# Patient Record
Sex: Male | Born: 1950
Health system: Southern US, Community
[De-identification: ages and names within clinical notes are randomized; demographics above are authoritative.]

## PROBLEM LIST (undated history)

## (undated) DIAGNOSIS — F0390 Unspecified dementia without behavioral disturbance: Secondary | ICD-10-CM

## (undated) DIAGNOSIS — I10 Essential (primary) hypertension: Secondary | ICD-10-CM

## (undated) DIAGNOSIS — E119 Type 2 diabetes mellitus without complications: Secondary | ICD-10-CM

## (undated) DIAGNOSIS — F329 Major depressive disorder, single episode, unspecified: Secondary | ICD-10-CM

## (undated) DIAGNOSIS — E7800 Pure hypercholesterolemia, unspecified: Secondary | ICD-10-CM

## (undated) DIAGNOSIS — F32A Depression, unspecified: Secondary | ICD-10-CM

## (undated) DIAGNOSIS — I714 Abdominal aortic aneurysm, without rupture, unspecified: Secondary | ICD-10-CM

## (undated) HISTORY — DX: Type 2 diabetes mellitus without complications: E11.9

## (undated) HISTORY — DX: Abdominal aortic aneurysm, without rupture, unspecified: I71.40

---
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
## 2004-10-24 ENCOUNTER — Ambulatory Visit (HOSPITAL_COMMUNITY): Admission: RE | Admit: 2004-10-24 | Discharge: 2004-10-24 | Payer: Self-pay | Admitting: *Deleted

## 2011-07-24 ENCOUNTER — Other Ambulatory Visit: Payer: Self-pay | Admitting: Internal Medicine

## 2011-07-24 DIAGNOSIS — R519 Headache, unspecified: Secondary | ICD-10-CM

## 2011-07-25 ENCOUNTER — Ambulatory Visit
Admission: RE | Admit: 2011-07-25 | Discharge: 2011-07-25 | Disposition: A | Discharge status: Home / Self Care | Payer: BC Managed Care – PPO | Source: Ambulatory Visit | Attending: Internal Medicine | Admitting: Internal Medicine

## 2011-07-25 DIAGNOSIS — R519 Headache, unspecified: Secondary | ICD-10-CM

## 2011-09-05 ENCOUNTER — Other Ambulatory Visit: Payer: Self-pay | Admitting: Neurology

## 2011-09-05 DIAGNOSIS — R519 Headache, unspecified: Secondary | ICD-10-CM

## 2011-09-09 ENCOUNTER — Ambulatory Visit
Admission: RE | Admit: 2011-09-09 | Discharge: 2011-09-09 | Disposition: A | Discharge status: Home / Self Care | Payer: BC Managed Care – PPO | Source: Ambulatory Visit | Attending: Neurology | Admitting: Neurology

## 2011-09-09 DIAGNOSIS — R519 Headache, unspecified: Secondary | ICD-10-CM

## 2011-09-09 MED ORDER — GADOBENATE DIMEGLUMINE 529 MG/ML IV SOLN
20.0000 mL | Freq: Once | INTRAVENOUS | Status: AC | PRN
  Administered 2011-09-09: 20 mL via INTRAVENOUS

## 2011-11-19 HISTORY — PX: OTHER SURGICAL HISTORY: SHX169

## 2011-11-21 DIAGNOSIS — G500 Trigeminal neuralgia: Secondary | ICD-10-CM | POA: Insufficient documentation

## 2011-11-21 DIAGNOSIS — Q282 Arteriovenous malformation of cerebral vessels: Secondary | ICD-10-CM | POA: Insufficient documentation

## 2011-12-17 DIAGNOSIS — I10 Essential (primary) hypertension: Secondary | ICD-10-CM | POA: Insufficient documentation

## 2011-12-17 DIAGNOSIS — E785 Hyperlipidemia, unspecified: Secondary | ICD-10-CM | POA: Insufficient documentation

## 2011-12-28 DIAGNOSIS — Z9889 Other specified postprocedural states: Secondary | ICD-10-CM | POA: Insufficient documentation

## 2012-02-13 DIAGNOSIS — H53129 Transient visual loss, unspecified eye: Secondary | ICD-10-CM | POA: Insufficient documentation

## 2012-03-26 DIAGNOSIS — Z8774 Personal history of (corrected) congenital malformations of heart and circulatory system: Secondary | ICD-10-CM | POA: Insufficient documentation

## 2012-04-06 ENCOUNTER — Other Ambulatory Visit: Payer: Self-pay | Admitting: Internal Medicine

## 2012-04-06 ENCOUNTER — Other Ambulatory Visit (HOSPITAL_COMMUNITY): Payer: Self-pay | Admitting: Internal Medicine

## 2012-04-06 DIAGNOSIS — R1011 Right upper quadrant pain: Secondary | ICD-10-CM

## 2012-04-06 DIAGNOSIS — R1110 Vomiting, unspecified: Secondary | ICD-10-CM

## 2012-04-07 ENCOUNTER — Ambulatory Visit (HOSPITAL_COMMUNITY)
Admission: RE | Admit: 2012-04-07 | Discharge: 2012-04-07 | Disposition: A | Discharge status: Home / Self Care | Payer: BC Managed Care – PPO | Source: Ambulatory Visit | Attending: Internal Medicine | Admitting: Internal Medicine

## 2012-04-07 DIAGNOSIS — Q619 Cystic kidney disease, unspecified: Secondary | ICD-10-CM | POA: Insufficient documentation

## 2012-04-07 DIAGNOSIS — R109 Unspecified abdominal pain: Secondary | ICD-10-CM | POA: Insufficient documentation

## 2012-04-07 DIAGNOSIS — R1011 Right upper quadrant pain: Secondary | ICD-10-CM

## 2012-04-07 DIAGNOSIS — R1110 Vomiting, unspecified: Secondary | ICD-10-CM | POA: Insufficient documentation

## 2013-12-28 ENCOUNTER — Other Ambulatory Visit: Payer: Self-pay | Admitting: Internal Medicine

## 2013-12-28 DIAGNOSIS — N451 Epididymitis: Secondary | ICD-10-CM

## 2013-12-29 ENCOUNTER — Ambulatory Visit
Admission: RE | Admit: 2013-12-29 | Discharge: 2013-12-29 | Disposition: A | Discharge status: Home / Self Care | Payer: No Typology Code available for payment source | Source: Ambulatory Visit | Attending: Internal Medicine | Admitting: Internal Medicine

## 2013-12-29 DIAGNOSIS — N451 Epididymitis: Secondary | ICD-10-CM

## 2014-06-29 DIAGNOSIS — N453 Epididymo-orchitis: Secondary | ICD-10-CM | POA: Diagnosis not present

## 2014-07-13 DIAGNOSIS — I10 Essential (primary) hypertension: Secondary | ICD-10-CM | POA: Diagnosis not present

## 2014-07-13 DIAGNOSIS — N453 Epididymo-orchitis: Secondary | ICD-10-CM | POA: Diagnosis not present

## 2014-07-13 DIAGNOSIS — E782 Mixed hyperlipidemia: Secondary | ICD-10-CM | POA: Diagnosis not present

## 2014-07-13 DIAGNOSIS — IMO0001 Reserved for inherently not codable concepts without codable children: Secondary | ICD-10-CM | POA: Diagnosis not present

## 2014-08-05 DIAGNOSIS — N509 Disorder of male genital organs, unspecified: Secondary | ICD-10-CM | POA: Diagnosis not present

## 2014-08-05 DIAGNOSIS — N453 Epididymo-orchitis: Secondary | ICD-10-CM | POA: Diagnosis not present

## 2015-10-19 DIAGNOSIS — E782 Mixed hyperlipidemia: Secondary | ICD-10-CM | POA: Diagnosis not present

## 2015-10-19 DIAGNOSIS — I10 Essential (primary) hypertension: Secondary | ICD-10-CM | POA: Diagnosis not present

## 2015-10-19 DIAGNOSIS — E1165 Type 2 diabetes mellitus with hyperglycemia: Secondary | ICD-10-CM | POA: Diagnosis not present

## 2015-10-26 DIAGNOSIS — G500 Trigeminal neuralgia: Secondary | ICD-10-CM | POA: Diagnosis not present

## 2015-10-26 DIAGNOSIS — I10 Essential (primary) hypertension: Secondary | ICD-10-CM | POA: Diagnosis not present

## 2015-10-26 DIAGNOSIS — E1165 Type 2 diabetes mellitus with hyperglycemia: Secondary | ICD-10-CM | POA: Diagnosis not present

## 2015-10-26 DIAGNOSIS — E782 Mixed hyperlipidemia: Secondary | ICD-10-CM | POA: Diagnosis not present

## 2016-01-24 DIAGNOSIS — E119 Type 2 diabetes mellitus without complications: Secondary | ICD-10-CM | POA: Diagnosis not present

## 2016-01-24 DIAGNOSIS — H52223 Regular astigmatism, bilateral: Secondary | ICD-10-CM | POA: Diagnosis not present

## 2016-01-24 DIAGNOSIS — H524 Presbyopia: Secondary | ICD-10-CM | POA: Diagnosis not present

## 2016-01-24 DIAGNOSIS — Z7984 Long term (current) use of oral hypoglycemic drugs: Secondary | ICD-10-CM | POA: Diagnosis not present

## 2016-01-24 DIAGNOSIS — H5202 Hypermetropia, left eye: Secondary | ICD-10-CM | POA: Diagnosis not present

## 2016-03-07 DIAGNOSIS — G500 Trigeminal neuralgia: Secondary | ICD-10-CM | POA: Diagnosis not present

## 2016-03-07 DIAGNOSIS — E782 Mixed hyperlipidemia: Secondary | ICD-10-CM | POA: Diagnosis not present

## 2016-03-07 DIAGNOSIS — I10 Essential (primary) hypertension: Secondary | ICD-10-CM | POA: Diagnosis not present

## 2016-03-07 DIAGNOSIS — E1165 Type 2 diabetes mellitus with hyperglycemia: Secondary | ICD-10-CM | POA: Diagnosis not present

## 2016-03-14 DIAGNOSIS — I671 Cerebral aneurysm, nonruptured: Secondary | ICD-10-CM | POA: Diagnosis not present

## 2016-03-14 DIAGNOSIS — E1165 Type 2 diabetes mellitus with hyperglycemia: Secondary | ICD-10-CM | POA: Diagnosis not present

## 2016-03-14 DIAGNOSIS — I69993 Ataxia following unspecified cerebrovascular disease: Secondary | ICD-10-CM | POA: Diagnosis not present

## 2016-03-14 DIAGNOSIS — E782 Mixed hyperlipidemia: Secondary | ICD-10-CM | POA: Diagnosis not present

## 2016-09-19 DIAGNOSIS — E782 Mixed hyperlipidemia: Secondary | ICD-10-CM | POA: Diagnosis not present

## 2016-09-19 DIAGNOSIS — E1165 Type 2 diabetes mellitus with hyperglycemia: Secondary | ICD-10-CM | POA: Diagnosis not present

## 2016-09-26 DIAGNOSIS — Z23 Encounter for immunization: Secondary | ICD-10-CM | POA: Diagnosis not present

## 2016-09-26 DIAGNOSIS — I10 Essential (primary) hypertension: Secondary | ICD-10-CM | POA: Diagnosis not present

## 2016-09-26 DIAGNOSIS — E782 Mixed hyperlipidemia: Secondary | ICD-10-CM | POA: Diagnosis not present

## 2016-09-26 DIAGNOSIS — E1165 Type 2 diabetes mellitus with hyperglycemia: Secondary | ICD-10-CM | POA: Diagnosis not present

## 2017-03-20 DIAGNOSIS — E782 Mixed hyperlipidemia: Secondary | ICD-10-CM | POA: Diagnosis not present

## 2017-03-20 DIAGNOSIS — Z0000 Encounter for general adult medical examination without abnormal findings: Secondary | ICD-10-CM | POA: Diagnosis not present

## 2017-03-20 DIAGNOSIS — Z23 Encounter for immunization: Secondary | ICD-10-CM | POA: Diagnosis not present

## 2017-03-20 DIAGNOSIS — Z125 Encounter for screening for malignant neoplasm of prostate: Secondary | ICD-10-CM | POA: Diagnosis not present

## 2017-03-20 DIAGNOSIS — E1165 Type 2 diabetes mellitus with hyperglycemia: Secondary | ICD-10-CM | POA: Diagnosis not present

## 2017-03-27 DIAGNOSIS — R269 Unspecified abnormalities of gait and mobility: Secondary | ICD-10-CM | POA: Diagnosis not present

## 2017-03-27 DIAGNOSIS — E782 Mixed hyperlipidemia: Secondary | ICD-10-CM | POA: Diagnosis not present

## 2017-03-27 DIAGNOSIS — E1165 Type 2 diabetes mellitus with hyperglycemia: Secondary | ICD-10-CM | POA: Diagnosis not present

## 2017-03-27 DIAGNOSIS — R1033 Periumbilical pain: Secondary | ICD-10-CM | POA: Diagnosis not present

## 2017-05-07 DIAGNOSIS — R319 Hematuria, unspecified: Secondary | ICD-10-CM | POA: Diagnosis not present

## 2017-08-29 DIAGNOSIS — Z23 Encounter for immunization: Secondary | ICD-10-CM | POA: Diagnosis not present

## 2017-10-16 DIAGNOSIS — E782 Mixed hyperlipidemia: Secondary | ICD-10-CM | POA: Diagnosis not present

## 2017-10-16 DIAGNOSIS — E1165 Type 2 diabetes mellitus with hyperglycemia: Secondary | ICD-10-CM | POA: Diagnosis not present

## 2017-10-23 DIAGNOSIS — E1165 Type 2 diabetes mellitus with hyperglycemia: Secondary | ICD-10-CM | POA: Diagnosis not present

## 2017-10-23 DIAGNOSIS — R269 Unspecified abnormalities of gait and mobility: Secondary | ICD-10-CM | POA: Diagnosis not present

## 2017-10-23 DIAGNOSIS — I671 Cerebral aneurysm, nonruptured: Secondary | ICD-10-CM | POA: Diagnosis not present

## 2017-10-23 DIAGNOSIS — E782 Mixed hyperlipidemia: Secondary | ICD-10-CM | POA: Diagnosis not present

## 2017-10-23 DIAGNOSIS — I10 Essential (primary) hypertension: Secondary | ICD-10-CM | POA: Diagnosis not present

## 2017-10-23 DIAGNOSIS — N50812 Left testicular pain: Secondary | ICD-10-CM | POA: Diagnosis not present

## 2017-10-30 DIAGNOSIS — N5082 Scrotal pain: Secondary | ICD-10-CM | POA: Diagnosis not present

## 2017-10-30 DIAGNOSIS — N50819 Testicular pain, unspecified: Secondary | ICD-10-CM | POA: Diagnosis not present

## 2017-11-06 DIAGNOSIS — N5082 Scrotal pain: Secondary | ICD-10-CM | POA: Diagnosis not present

## 2018-03-04 DIAGNOSIS — E119 Type 2 diabetes mellitus without complications: Secondary | ICD-10-CM | POA: Diagnosis not present

## 2018-04-09 DIAGNOSIS — E782 Mixed hyperlipidemia: Secondary | ICD-10-CM | POA: Diagnosis not present

## 2018-04-09 DIAGNOSIS — Z0000 Encounter for general adult medical examination without abnormal findings: Secondary | ICD-10-CM | POA: Diagnosis not present

## 2018-04-09 DIAGNOSIS — I10 Essential (primary) hypertension: Secondary | ICD-10-CM | POA: Diagnosis not present

## 2018-04-09 DIAGNOSIS — Z125 Encounter for screening for malignant neoplasm of prostate: Secondary | ICD-10-CM | POA: Diagnosis not present

## 2018-04-09 DIAGNOSIS — M549 Dorsalgia, unspecified: Secondary | ICD-10-CM | POA: Diagnosis not present

## 2018-04-09 DIAGNOSIS — E1165 Type 2 diabetes mellitus with hyperglycemia: Secondary | ICD-10-CM | POA: Diagnosis not present

## 2018-04-16 DIAGNOSIS — E6609 Other obesity due to excess calories: Secondary | ICD-10-CM | POA: Diagnosis not present

## 2018-04-16 DIAGNOSIS — R69 Illness, unspecified: Secondary | ICD-10-CM | POA: Diagnosis not present

## 2018-04-16 DIAGNOSIS — E782 Mixed hyperlipidemia: Secondary | ICD-10-CM | POA: Diagnosis not present

## 2018-04-16 DIAGNOSIS — I69993 Ataxia following unspecified cerebrovascular disease: Secondary | ICD-10-CM | POA: Diagnosis not present

## 2018-04-16 DIAGNOSIS — I10 Essential (primary) hypertension: Secondary | ICD-10-CM | POA: Diagnosis not present

## 2018-04-16 DIAGNOSIS — R269 Unspecified abnormalities of gait and mobility: Secondary | ICD-10-CM | POA: Diagnosis not present

## 2018-04-16 DIAGNOSIS — E1165 Type 2 diabetes mellitus with hyperglycemia: Secondary | ICD-10-CM | POA: Diagnosis not present

## 2018-04-16 DIAGNOSIS — G500 Trigeminal neuralgia: Secondary | ICD-10-CM | POA: Diagnosis not present

## 2018-07-06 DIAGNOSIS — H40053 Ocular hypertension, bilateral: Secondary | ICD-10-CM | POA: Diagnosis not present

## 2018-07-24 DIAGNOSIS — R69 Illness, unspecified: Secondary | ICD-10-CM | POA: Diagnosis not present

## 2018-07-24 DIAGNOSIS — I69993 Ataxia following unspecified cerebrovascular disease: Secondary | ICD-10-CM | POA: Diagnosis not present

## 2018-07-24 DIAGNOSIS — Z23 Encounter for immunization: Secondary | ICD-10-CM | POA: Diagnosis not present

## 2018-09-04 DIAGNOSIS — R69 Illness, unspecified: Secondary | ICD-10-CM | POA: Diagnosis not present

## 2018-10-22 DIAGNOSIS — E1165 Type 2 diabetes mellitus with hyperglycemia: Secondary | ICD-10-CM | POA: Diagnosis not present

## 2018-10-22 DIAGNOSIS — I10 Essential (primary) hypertension: Secondary | ICD-10-CM | POA: Diagnosis not present

## 2018-10-22 DIAGNOSIS — E782 Mixed hyperlipidemia: Secondary | ICD-10-CM | POA: Diagnosis not present

## 2018-10-29 DIAGNOSIS — E1165 Type 2 diabetes mellitus with hyperglycemia: Secondary | ICD-10-CM | POA: Diagnosis not present

## 2018-10-29 DIAGNOSIS — E782 Mixed hyperlipidemia: Secondary | ICD-10-CM | POA: Diagnosis not present

## 2018-10-29 DIAGNOSIS — R69 Illness, unspecified: Secondary | ICD-10-CM | POA: Diagnosis not present

## 2018-10-29 DIAGNOSIS — I10 Essential (primary) hypertension: Secondary | ICD-10-CM | POA: Diagnosis not present

## 2019-02-18 DIAGNOSIS — R69 Illness, unspecified: Secondary | ICD-10-CM | POA: Diagnosis not present

## 2019-02-18 DIAGNOSIS — E782 Mixed hyperlipidemia: Secondary | ICD-10-CM | POA: Diagnosis not present

## 2019-02-18 DIAGNOSIS — E1165 Type 2 diabetes mellitus with hyperglycemia: Secondary | ICD-10-CM | POA: Diagnosis not present

## 2019-02-25 DIAGNOSIS — R69 Illness, unspecified: Secondary | ICD-10-CM | POA: Diagnosis not present

## 2019-02-25 DIAGNOSIS — E782 Mixed hyperlipidemia: Secondary | ICD-10-CM | POA: Diagnosis not present

## 2019-02-25 DIAGNOSIS — E1165 Type 2 diabetes mellitus with hyperglycemia: Secondary | ICD-10-CM | POA: Diagnosis not present

## 2019-02-25 DIAGNOSIS — I10 Essential (primary) hypertension: Secondary | ICD-10-CM | POA: Diagnosis not present

## 2019-04-04 ENCOUNTER — Other Ambulatory Visit: Payer: Self-pay

## 2019-04-04 ENCOUNTER — Emergency Department (HOSPITAL_COMMUNITY)
Admission: EM | Admit: 2019-04-04 | Discharge: 2019-04-05 | Disposition: A | Discharge status: Home / Self Care | Payer: Medicare HMO | Attending: Emergency Medicine | Admitting: Emergency Medicine

## 2019-04-04 ENCOUNTER — Encounter (HOSPITAL_COMMUNITY): Payer: Self-pay | Admitting: Emergency Medicine

## 2019-04-04 DIAGNOSIS — I10 Essential (primary) hypertension: Secondary | ICD-10-CM | POA: Diagnosis not present

## 2019-04-04 DIAGNOSIS — F329 Major depressive disorder, single episode, unspecified: Secondary | ICD-10-CM | POA: Diagnosis not present

## 2019-04-04 DIAGNOSIS — Z046 Encounter for general psychiatric examination, requested by authority: Secondary | ICD-10-CM

## 2019-04-04 DIAGNOSIS — F17220 Nicotine dependence, chewing tobacco, uncomplicated: Secondary | ICD-10-CM | POA: Diagnosis not present

## 2019-04-04 DIAGNOSIS — F17210 Nicotine dependence, cigarettes, uncomplicated: Secondary | ICD-10-CM | POA: Insufficient documentation

## 2019-04-04 DIAGNOSIS — R4689 Other symptoms and signs involving appearance and behavior: Secondary | ICD-10-CM | POA: Diagnosis not present

## 2019-04-04 DIAGNOSIS — F0151 Vascular dementia with behavioral disturbance: Secondary | ICD-10-CM | POA: Diagnosis present

## 2019-04-04 DIAGNOSIS — F0390 Unspecified dementia without behavioral disturbance: Secondary | ICD-10-CM | POA: Insufficient documentation

## 2019-04-04 DIAGNOSIS — Z79899 Other long term (current) drug therapy: Secondary | ICD-10-CM | POA: Insufficient documentation

## 2019-04-04 DIAGNOSIS — Z7984 Long term (current) use of oral hypoglycemic drugs: Secondary | ICD-10-CM | POA: Diagnosis not present

## 2019-04-04 DIAGNOSIS — R259 Unspecified abnormal involuntary movements: Secondary | ICD-10-CM | POA: Diagnosis not present

## 2019-04-04 DIAGNOSIS — R69 Illness, unspecified: Secondary | ICD-10-CM | POA: Diagnosis not present

## 2019-04-04 HISTORY — DX: Depression, unspecified: F32.A

## 2019-04-04 HISTORY — DX: Unspecified dementia, unspecified severity, without behavioral disturbance, psychotic disturbance, mood disturbance, and anxiety: F03.90

## 2019-04-04 HISTORY — DX: Pure hypercholesterolemia, unspecified: E78.00

## 2019-04-04 HISTORY — DX: Essential (primary) hypertension: I10

## 2019-04-04 LAB — URINALYSIS, ROUTINE W REFLEX MICROSCOPIC
Bacteria, UA: NONE SEEN
Bilirubin Urine: NEGATIVE
Glucose, UA: NEGATIVE mg/dL
Ketones, ur: NEGATIVE mg/dL
Leukocytes,Ua: NEGATIVE
Nitrite: NEGATIVE
Protein, ur: 30 mg/dL — AB
Specific Gravity, Urine: 1.024 (ref 1.005–1.030)
pH: 5 (ref 5.0–8.0)

## 2019-04-04 LAB — COMPREHENSIVE METABOLIC PANEL
ALT: 22 U/L (ref 0–44)
AST: 19 U/L (ref 15–41)
Albumin: 4.2 g/dL (ref 3.5–5.0)
Alkaline Phosphatase: 61 U/L (ref 38–126)
Anion gap: 10 (ref 5–15)
BUN: 8 mg/dL (ref 8–23)
CO2: 23 mmol/L (ref 22–32)
Calcium: 9.3 mg/dL (ref 8.9–10.3)
Chloride: 106 mmol/L (ref 98–111)
Creatinine, Ser: 0.89 mg/dL (ref 0.61–1.24)
GFR calc Af Amer: >60 mL/min (ref >60)
GFR calc non Af Amer: >60 mL/min (ref >60)
Glucose, Bld: 145 mg/dL — ABNORMAL HIGH (ref 70–99)
Potassium: 3.5 mmol/L (ref 3.5–5.1)
Sodium: 139 mmol/L (ref 135–145)
Total Bilirubin: 0.6 mg/dL (ref 0.3–1.2)
Total Protein: 6.8 g/dL (ref 6.5–8.1)

## 2019-04-04 LAB — CBC WITH DIFFERENTIAL/PLATELET
Abs Immature Granulocytes: 0.02 10*3/uL (ref 0.00–0.07)
Basophils Absolute: 0.1 10*3/uL (ref 0.0–0.1)
Basophils Relative: 1 %
Eosinophils Absolute: 0 10*3/uL (ref 0.0–0.5)
Eosinophils Relative: 0 %
HCT: 47.2 % (ref 39.0–52.0)
Hemoglobin: 15.5 g/dL (ref 13.0–17.0)
Immature Granulocytes: 0 %
Lymphocytes Relative: 25 %
Lymphs Abs: 2.4 10*3/uL (ref 0.7–4.0)
MCH: 27.8 pg (ref 26.0–34.0)
MCHC: 32.8 g/dL (ref 30.0–36.0)
MCV: 84.7 fL (ref 80.0–100.0)
Monocytes Absolute: 0.5 10*3/uL (ref 0.1–1.0)
Monocytes Relative: 5 %
Neutro Abs: 6.7 10*3/uL (ref 1.7–7.7)
Neutrophils Relative %: 69 %
Platelets: 206 10*3/uL (ref 150–400)
RBC: 5.57 MIL/uL (ref 4.22–5.81)
RDW: 12.9 % (ref 11.5–15.5)
WBC: 9.7 10*3/uL (ref 4.0–10.5)
nRBC: 0 % (ref 0.0–0.2)

## 2019-04-04 LAB — RAPID URINE DRUG SCREEN, HOSP PERFORMED
Amphetamines: NOT DETECTED
Barbiturates: NOT DETECTED
Benzodiazepines: NOT DETECTED
Cocaine: NOT DETECTED
Opiates: NOT DETECTED
Tetrahydrocannabinol: NOT DETECTED

## 2019-04-04 LAB — SALICYLATE LEVEL: Salicylate Lvl: <7 mg/dL (ref 2.8–30.0)

## 2019-04-04 LAB — ETHANOL: Alcohol, Ethyl (B): <10 mg/dL (ref <10)

## 2019-04-04 LAB — ACETAMINOPHEN LEVEL: Acetaminophen (Tylenol), Serum: <10 ug/mL — ABNORMAL LOW (ref 10–30)

## 2019-04-04 MED ORDER — ACETAMINOPHEN 325 MG PO TABS: 650.0000 mg | ORAL_TABLET | ORAL | Status: DC | PRN

## 2019-04-04 MED ORDER — ALUM & MAG HYDROXIDE-SIMETH 200-200-20 MG/5ML PO SUSP: 30.0000 mL | Freq: Four times a day (QID) | ORAL | Status: DC | PRN

## 2019-04-04 MED ORDER — ZOLPIDEM TARTRATE 5 MG PO TABS: 5.0000 mg | ORAL_TABLET | Freq: Every evening | ORAL | Status: DC | PRN

## 2019-04-04 MED ORDER — ONDANSETRON HCL 4 MG PO TABS: 4.0000 mg | ORAL_TABLET | Freq: Three times a day (TID) | ORAL | Status: DC | PRN

## 2019-04-04 MED ORDER — NICOTINE 21 MG/24HR TD PT24: 21.0000 mg | MEDICATED_PATCH | Freq: Every day | TRANSDERMAL | Status: DC

## 2019-04-04 NOTE — ED Notes (Signed)
Patient denies pain and is resting comfortably.  

## 2019-04-04 NOTE — BHH Counselor (Signed)
At 2020: Clinician called the TTS cart however no one answered. Clinician to call again.    Redmond Pulling, MS, [facility], [facility] Triage Specialist [phone]

## 2019-04-04 NOTE — ED Notes (Signed)
Pt placed in paper scrubs, belongings placed in bag, wanded by security.
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
## 2019-04-04 NOTE — ED Notes (Signed)
Pt escorted over with RN and security. Dressed in [employer]. Pt cooperative and pleasant. States that he just doesn't understand why he has to stay the night. Explained the process to pt and he states understanding but is not happy about staying. Given sandwich bag and drink along with cheese and crackers. Pt watching TV and content. No sitter available until possibly 2330. Will continue to monitor.

## 2019-04-04 NOTE — ED Notes (Addendum)
Daughter Clide Cliff updated on the phone.  [phone]
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
## 2019-04-04 NOTE — ED Notes (Signed)
Pt refused to sign belongings being sent to security, states he is not spending the night. Watch, keys, wallet with cards and $4, gold necklace locked in security envelope.
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
## 2019-04-05 ENCOUNTER — Encounter (HOSPITAL_COMMUNITY): Payer: Self-pay

## 2019-04-05 DIAGNOSIS — F0390 Unspecified dementia without behavioral disturbance: Secondary | ICD-10-CM | POA: Diagnosis present

## 2019-04-05 DIAGNOSIS — R69 Illness, unspecified: Secondary | ICD-10-CM | POA: Diagnosis not present

## 2019-04-05 DIAGNOSIS — F0391 Unspecified dementia with behavioral disturbance: Secondary | ICD-10-CM

## 2019-04-05 DIAGNOSIS — R456 Violent behavior: Secondary | ICD-10-CM | POA: Diagnosis not present

## 2019-04-05 DIAGNOSIS — R4689 Other symptoms and signs involving appearance and behavior: Secondary | ICD-10-CM | POA: Clinically undetermined

## 2019-04-05 NOTE — Discharge Instructions (Addendum)
Contact a health care provider if: You have any new symptoms. You have problems with choking or swallowing. You have any symptoms of a different illness. Get help right away if: You develop a fever. You have new or worsening confusion. You have new or worsening sleepiness. You have a hard time staying awake. You or your family members become concerned for your safety.

## 2019-04-05 NOTE — ED Notes (Signed)
Patient verbalizes understanding of discharge instructions. Opportunity for questioning and answers were provided. Armband removed by staff, pt discharged from ED home via POV.  

## 2019-04-05 NOTE — ED Notes (Signed)
Breakfast tray ordered 

## 2019-04-05 NOTE — ED Notes (Signed)
Copies made of chart from previous RN. Made copy for medical records and placed in drawer.

## 2019-04-05 NOTE — ED Provider Notes (Signed)
Emergency Medicine Observation Re-evaluation Note  [place] Thomas is a 68 y.o. male, seen on rounds today.  Pt initially presented to the ED for complaints of dementia and aggressive behavior.   Currently, the patient is eating.  Physical Exam  BP (!) 148/93   Pulse 93   Temp 99.7 F (37.6 C) (Oral)   Resp 17   Ht 6' (1.829 m)   Wt 112 kg   SpO2 95%   BMI 33.50 kg/m  Physical Exam Constitutional: Patient appears well-developed and well-nourished. No distress.  HENT:  Head: Normocephalic and atraumatic.  Pulmonary/Chest: Effort normal  No respiratory distress.  Abdominal: No distension Neurological: Pt is sleeping.  Skin:  Pt is not diaphoretic.  ED Course / MDM  EKG:EKG Interpretation  Date/Time:  Sunday Apr 04 2019 19:06:25 EDT Ventricular Rate:  102 PR Interval:    QRS Duration: 88 QT Interval:  347 QTC Calculation: 452 R Axis:   147 Text Interpretation:  Sinus tachycardia Paired ventricular premature complexes Aberrant conduction of SV complex(es) Left posterior fascicular block Borderline repolarization abnormality No old tracing to compare Confirmed by Dione Booze ([id number]) on 04/05/2019 12:16:12 AM    I have reviewed the labs performed to date as well as medications administered while in observation.   Results for orders placed or performed during the hospital encounter of 04/04/19  Comprehensive metabolic panel  Result Value Ref Range   Sodium 139 135 - 145 mmol/L   Potassium 3.5 3.5 - 5.1 mmol/L   Chloride 106 98 - 111 mmol/L   CO2 23 22 - 32 mmol/L   Glucose, Bld 145 (H) 70 - 99 mg/dL   BUN 8 8 - 23 mg/dL   Creatinine, Ser 9.75 0.61 - 1.24 mg/dL   Calcium 9.3 8.9 - 30.0 mg/dL   Total Protein 6.8 6.5 - 8.1 g/dL   Albumin 4.2 3.5 - 5.0 g/dL   AST 19 15 - 41 U/L   ALT 22 0 - 44 U/L   Alkaline Phosphatase 61 38 - 126 U/L   Total Bilirubin 0.6 0.3 - 1.2 mg/dL   GFR calc non Af Amer >60 >60 mL/min   GFR calc Af Amer >60 >60 mL/min   Anion gap 10 5 - 15   Ethanol  Result Value Ref Range   Alcohol, Ethyl (B) <10 <10 mg/dL  Urine rapid drug screen (hosp performed)  Result Value Ref Range   Opiates NONE DETECTED NONE DETECTED   Cocaine NONE DETECTED NONE DETECTED   Benzodiazepines NONE DETECTED NONE DETECTED   Amphetamines NONE DETECTED NONE DETECTED   Tetrahydrocannabinol NONE DETECTED NONE DETECTED   Barbiturates NONE DETECTED NONE DETECTED  CBC with Diff  Result Value Ref Range   WBC 9.7 4.0 - 10.5 K/uL   RBC 5.57 4.22 - 5.81 MIL/uL   Hemoglobin 15.5 13.0 - 17.0 g/dL   HCT 51.1 02.1 - 11.7 %   MCV 84.7 80.0 - 100.0 fL   MCH 27.8 26.0 - 34.0 pg   MCHC 32.8 30.0 - 36.0 g/dL   RDW 35.6 70.1 - 41.0 %   Platelets 206 150 - 400 K/uL   nRBC 0.0 0.0 - 0.2 %   Neutrophils Relative % 69 %   Neutro Abs 6.7 1.7 - 7.7 K/uL   Lymphocytes Relative 25 %   Lymphs Abs 2.4 0.7 - 4.0 K/uL   Monocytes Relative 5 %   Monocytes Absolute 0.5 0.1 - 1.0 K/uL   Eosinophils Relative 0 %   Eosinophils  Absolute 0.0 0.0 - 0.5 K/uL   Basophils Relative 1 %   Basophils Absolute 0.1 0.0 - 0.1 K/uL   Immature Granulocytes 0 %   Abs Immature Granulocytes 0.02 0.00 - 0.07 K/uL  Salicylate level  Result Value Ref Range   Salicylate Lvl <7.0 2.8 - 30.0 mg/dL  Acetaminophen level  Result Value Ref Range   Acetaminophen (Tylenol), Serum <10 (L) 10 - 30 ug/mL  Urinalysis, Routine w reflex microscopic  Result Value Ref Range   Color, Urine YELLOW YELLOW   APPearance HAZY (A) CLEAR   Specific Gravity, Urine 1.024 1.005 - 1.030   pH 5.0 5.0 - 8.0   Glucose, UA NEGATIVE NEGATIVE mg/dL   Hgb urine dipstick SMALL (A) NEGATIVE   Bilirubin Urine NEGATIVE NEGATIVE   Ketones, ur NEGATIVE NEGATIVE mg/dL   Protein, ur 30 (A) NEGATIVE mg/dL   Nitrite NEGATIVE NEGATIVE   Leukocytes,Ua NEGATIVE NEGATIVE   RBC / HPF 21-50 0 - 5 RBC/hpf   WBC, UA 0-5 0 - 5 WBC/hpf   Bacteria, UA NONE SEEN NONE SEEN   Squamous Epithelial / LPF 0-5 0 - 5   Mucus PRESENT    Hyaline  Casts, UA PRESENT    Ca Oxalate Crys, UA PRESENT     Plan  Current plan is for Geri-psych placement. Patient is under full IVC at this time.   Arthor CaptainHarris, Dajaun Goldring, PA-C 04/05/19 0757    Melene PlanFloyd, Dan, DO 04/05/19 1117
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
## 2019-04-05 NOTE — ED Notes (Signed)
TTS in progress 

## 2019-04-09 DIAGNOSIS — E1165 Type 2 diabetes mellitus with hyperglycemia: Secondary | ICD-10-CM | POA: Diagnosis not present

## 2019-04-09 DIAGNOSIS — Z7189 Other specified counseling: Secondary | ICD-10-CM | POA: Diagnosis not present

## 2019-04-09 DIAGNOSIS — E782 Mixed hyperlipidemia: Secondary | ICD-10-CM | POA: Diagnosis not present

## 2019-06-02 DIAGNOSIS — Z125 Encounter for screening for malignant neoplasm of prostate: Secondary | ICD-10-CM | POA: Diagnosis not present

## 2019-06-02 DIAGNOSIS — E782 Mixed hyperlipidemia: Secondary | ICD-10-CM | POA: Diagnosis not present

## 2019-06-02 DIAGNOSIS — R69 Illness, unspecified: Secondary | ICD-10-CM | POA: Diagnosis not present

## 2019-06-02 DIAGNOSIS — E1165 Type 2 diabetes mellitus with hyperglycemia: Secondary | ICD-10-CM | POA: Diagnosis not present

## 2019-06-02 DIAGNOSIS — I10 Essential (primary) hypertension: Secondary | ICD-10-CM | POA: Diagnosis not present

## 2019-06-02 DIAGNOSIS — Z0000 Encounter for general adult medical examination without abnormal findings: Secondary | ICD-10-CM | POA: Diagnosis not present

## 2019-06-02 DIAGNOSIS — Z7189 Other specified counseling: Secondary | ICD-10-CM | POA: Diagnosis not present

## 2019-06-09 DIAGNOSIS — R69 Illness, unspecified: Secondary | ICD-10-CM | POA: Diagnosis not present

## 2019-06-09 DIAGNOSIS — E1165 Type 2 diabetes mellitus with hyperglycemia: Secondary | ICD-10-CM | POA: Diagnosis not present

## 2019-06-09 DIAGNOSIS — Z7189 Other specified counseling: Secondary | ICD-10-CM | POA: Diagnosis not present

## 2019-06-09 DIAGNOSIS — Z0000 Encounter for general adult medical examination without abnormal findings: Secondary | ICD-10-CM | POA: Diagnosis not present

## 2019-06-09 DIAGNOSIS — I10 Essential (primary) hypertension: Secondary | ICD-10-CM | POA: Diagnosis not present

## 2019-06-09 DIAGNOSIS — N5082 Scrotal pain: Secondary | ICD-10-CM | POA: Diagnosis not present

## 2019-06-09 DIAGNOSIS — E782 Mixed hyperlipidemia: Secondary | ICD-10-CM | POA: Diagnosis not present

## 2019-07-23 DIAGNOSIS — Z23 Encounter for immunization: Secondary | ICD-10-CM | POA: Diagnosis not present

## 2019-09-01 DIAGNOSIS — E119 Type 2 diabetes mellitus without complications: Secondary | ICD-10-CM | POA: Diagnosis not present

## 2019-10-27 DIAGNOSIS — E782 Mixed hyperlipidemia: Secondary | ICD-10-CM | POA: Diagnosis not present

## 2019-10-27 DIAGNOSIS — I10 Essential (primary) hypertension: Secondary | ICD-10-CM | POA: Diagnosis not present

## 2019-10-27 DIAGNOSIS — E1165 Type 2 diabetes mellitus with hyperglycemia: Secondary | ICD-10-CM | POA: Diagnosis not present

## 2019-11-03 DIAGNOSIS — E782 Mixed hyperlipidemia: Secondary | ICD-10-CM | POA: Diagnosis not present

## 2019-11-03 DIAGNOSIS — R69 Illness, unspecified: Secondary | ICD-10-CM | POA: Diagnosis not present

## 2019-11-03 DIAGNOSIS — E1165 Type 2 diabetes mellitus with hyperglycemia: Secondary | ICD-10-CM | POA: Diagnosis not present

## 2019-11-03 DIAGNOSIS — I10 Essential (primary) hypertension: Secondary | ICD-10-CM | POA: Diagnosis not present

## 2020-01-15 DIAGNOSIS — Z23 Encounter for immunization: Secondary | ICD-10-CM | POA: Diagnosis not present

## 2020-03-01 DIAGNOSIS — E1165 Type 2 diabetes mellitus with hyperglycemia: Secondary | ICD-10-CM | POA: Diagnosis not present

## 2020-03-01 DIAGNOSIS — E782 Mixed hyperlipidemia: Secondary | ICD-10-CM | POA: Diagnosis not present

## 2020-03-01 DIAGNOSIS — R1084 Generalized abdominal pain: Secondary | ICD-10-CM | POA: Diagnosis not present

## 2020-03-01 DIAGNOSIS — K5901 Slow transit constipation: Secondary | ICD-10-CM | POA: Diagnosis not present

## 2020-03-01 DIAGNOSIS — R112 Nausea with vomiting, unspecified: Secondary | ICD-10-CM | POA: Diagnosis not present

## 2020-03-01 DIAGNOSIS — I10 Essential (primary) hypertension: Secondary | ICD-10-CM | POA: Diagnosis not present

## 2020-03-01 DIAGNOSIS — R269 Unspecified abnormalities of gait and mobility: Secondary | ICD-10-CM | POA: Diagnosis not present

## 2020-03-06 ENCOUNTER — Emergency Department (HOSPITAL_COMMUNITY): Payer: Medicare HMO

## 2020-03-06 ENCOUNTER — Other Ambulatory Visit: Payer: Self-pay

## 2020-03-06 ENCOUNTER — Encounter (HOSPITAL_COMMUNITY): Payer: Self-pay

## 2020-03-06 ENCOUNTER — Emergency Department (HOSPITAL_COMMUNITY)
Admission: EM | Admit: 2020-03-06 | Discharge: 2020-03-06 | Disposition: A | Discharge status: Home / Self Care | Payer: Medicare HMO | Attending: Emergency Medicine | Admitting: Emergency Medicine

## 2020-03-06 DIAGNOSIS — I10 Essential (primary) hypertension: Secondary | ICD-10-CM | POA: Diagnosis not present

## 2020-03-06 DIAGNOSIS — Z79899 Other long term (current) drug therapy: Secondary | ICD-10-CM | POA: Insufficient documentation

## 2020-03-06 DIAGNOSIS — Z7984 Long term (current) use of oral hypoglycemic drugs: Secondary | ICD-10-CM | POA: Diagnosis not present

## 2020-03-06 DIAGNOSIS — I714 Abdominal aortic aneurysm, without rupture, unspecified: Secondary | ICD-10-CM

## 2020-03-06 DIAGNOSIS — R109 Unspecified abdominal pain: Secondary | ICD-10-CM | POA: Diagnosis present

## 2020-03-06 DIAGNOSIS — F17210 Nicotine dependence, cigarettes, uncomplicated: Secondary | ICD-10-CM | POA: Diagnosis not present

## 2020-03-06 DIAGNOSIS — K253 Acute gastric ulcer without hemorrhage or perforation: Secondary | ICD-10-CM | POA: Diagnosis not present

## 2020-03-06 DIAGNOSIS — K5730 Diverticulosis of large intestine without perforation or abscess without bleeding: Secondary | ICD-10-CM | POA: Diagnosis not present

## 2020-03-06 LAB — COMPREHENSIVE METABOLIC PANEL
ALT: 17 U/L (ref 0–44)
AST: 13 U/L — ABNORMAL LOW (ref 15–41)
Albumin: 4.2 g/dL (ref 3.5–5.0)
Alkaline Phosphatase: 78 U/L (ref 38–126)
Anion gap: 12 (ref 5–15)
BUN: 12 mg/dL (ref 8–23)
CO2: 27 mmol/L (ref 22–32)
Calcium: 9.1 mg/dL (ref 8.9–10.3)
Chloride: 102 mmol/L (ref 98–111)
Creatinine, Ser: 1.02 mg/dL (ref 0.61–1.24)
GFR calc Af Amer: >60 mL/min (ref >60)
GFR calc non Af Amer: >60 mL/min (ref >60)
Glucose, Bld: 116 mg/dL — ABNORMAL HIGH (ref 70–99)
Potassium: 3.8 mmol/L (ref 3.5–5.1)
Sodium: 141 mmol/L (ref 135–145)
Total Bilirubin: 0.6 mg/dL (ref 0.3–1.2)
Total Protein: 7.2 g/dL (ref 6.5–8.1)

## 2020-03-06 LAB — URINALYSIS, ROUTINE W REFLEX MICROSCOPIC
Glucose, UA: NEGATIVE mg/dL
Hgb urine dipstick: NEGATIVE
Ketones, ur: 5 mg/dL — AB
Nitrite: NEGATIVE
Protein, ur: 30 mg/dL — AB
Specific Gravity, Urine: 1.03 (ref 1.005–1.030)
pH: 5 (ref 5.0–8.0)

## 2020-03-06 LAB — CBC
HCT: 45.7 % (ref 39.0–52.0)
Hemoglobin: 14.5 g/dL (ref 13.0–17.0)
MCH: 27.6 pg (ref 26.0–34.0)
MCHC: 31.7 g/dL (ref 30.0–36.0)
MCV: 87 fL (ref 80.0–100.0)
Platelets: 283 10*3/uL (ref 150–400)
RBC: 5.25 MIL/uL (ref 4.22–5.81)
RDW: 12.6 % (ref 11.5–15.5)
WBC: 12.7 10*3/uL — ABNORMAL HIGH (ref 4.0–10.5)
nRBC: 0 % (ref 0.0–0.2)

## 2020-03-06 LAB — LIPASE, BLOOD: Lipase: 30 U/L (ref 11–51)

## 2020-03-06 MED ORDER — PANTOPRAZOLE SODIUM 40 MG PO TBEC
40.0000 mg | DELAYED_RELEASE_TABLET | Freq: Once | ORAL | Status: AC
  Administered 2020-03-06: 40 mg via ORAL
  Filled 2020-03-06: qty 1

## 2020-03-06 MED ORDER — FENTANYL CITRATE (PF) 100 MCG/2ML IJ SOLN
100.0000 ug | Freq: Once | INTRAMUSCULAR | Status: AC
  Administered 2020-03-06: 22:00:00 100 ug via INTRAVENOUS
  Filled 2020-03-06: qty 2

## 2020-03-06 MED ORDER — SODIUM CHLORIDE 0.9% FLUSH: 3.0000 mL | Freq: Once | INTRAVENOUS | Status: DC

## 2020-03-06 MED ORDER — SODIUM CHLORIDE 0.9 % IV BOLUS
1000.0000 mL | Freq: Once | INTRAVENOUS | Status: AC
  Administered 2020-03-06: 1000 mL via INTRAVENOUS

## 2020-03-06 MED ORDER — SODIUM CHLORIDE (PF) 0.9 % IJ SOLN
INTRAMUSCULAR | Status: AC
  Filled 2020-03-06: qty 50

## 2020-03-06 MED ORDER — IOHEXOL 300 MG/ML  SOLN
100.0000 mL | Freq: Once | INTRAMUSCULAR | Status: AC | PRN
  Administered 2020-03-06: 100 mL via INTRAVENOUS

## 2020-03-06 MED ORDER — PANTOPRAZOLE SODIUM 40 MG PO TBEC: 40.0000 mg | DELAYED_RELEASE_TABLET | Freq: Two times a day (BID) | ORAL | 0 refills | Status: AC

## 2020-03-06 MED ORDER — FAMOTIDINE 20 MG PO TABS: 20.0000 mg | ORAL_TABLET | Freq: Two times a day (BID) | ORAL | 0 refills | Status: AC

## 2020-03-06 NOTE — ED Notes (Signed)
Urine culture sent to the lab. 

## 2020-03-06 NOTE — ED Notes (Signed)
Unable to get IV access. 2 RN attempt. IV team consulted.

## 2020-03-06 NOTE — ED Notes (Signed)
Patient given warm blankets and is resting comfortably. Call bell within reach. No other needs expressed.

## 2020-03-06 NOTE — Discharge Instructions (Addendum)
Your CT scan shows an ulcer in your stomach.  Do not drink alcohol or take NSAIDs.  It is important to follow-up with the gastroenterologist for an endoscopy and further treatment.  Your CT scan also shows an abdominal aortic aneurysm.  You will need to follow-up with vascular surgery for this.  If you develop worsening, continued, or recurrent abdominal pain, uncontrolled vomiting, fever, chest or back pain, or any other new/concerning symptoms then return to the ER for evaluation.   Do not take Ibuprofen/Advil/Aleve/Motrin/Goody Powders/Naproxen/BC powders/Meloxicam/Diclofenac/Indomethacin and other Nonsteroidal anti-inflammatory medications

## 2020-03-06 NOTE — ED Provider Notes (Signed)
[facility]-EMERGENCY DEPT Provider Note   CSN: [id number] Arrival date & time: 03/06/20  1441     History Chief Complaint  Patient presents with  . Abdominal Pain    Raymond Thomas is a 69 y.o. male.  HPI 69 year old male with abdominal pain.  Goes across his upper abdomen, down his side and into his back.  Ongoing since March.  Has been taking Aleve with no relief.  He vomited many weeks ago but none since.  Has not had many bowel movements and is not eating much because of no appetite.  No diarrhea.  No fevers.   Past Medical History:  Diagnosis Date  . Dementia (HCC)    per gf  . Depression    according to gf  . Elevated cholesterol   . Hypertension     Patient Active Problem List   Diagnosis Date Noted  . Aggressive behavior of adult 04/05/2019  . Dementia (HCC) 04/05/2019    Past Surgical History:  Procedure Laterality Date  . OTHER SURGICAL HISTORY  2013   aneurysm in march 2013       Family History  Problem Relation Age of Onset  . Glaucoma Mother     Social History   Tobacco Use  . Smoking status: Current Every Day Smoker    Packs/day: 0.50    Types: Cigarettes  . Smokeless tobacco: Current User  Substance Use Topics  . Alcohol use: Not Currently  . Drug use: Not Currently    Comment: "used to use whatever I could"    Home Medications Prior to Admission medications   Medication Sig Start Date End Date Taking? Authorizing Provider  amLODipine (NORVASC) 5 MG tablet Take 5 mg by mouth daily.   Yes [provider]  Cholecalciferol (VITAMIN D3) 50 MCG (2000 UT) TABS Take 2,000 Units by mouth daily with breakfast.   Yes [provider]  donepezil (ARICEPT) 10 MG tablet Take 10 mg by mouth at bedtime.   Yes [provider]  lovastatin (MEVACOR) 40 MG tablet Take 80 mg by mouth at bedtime.   Yes [provider]  meloxicam (MOBIC) 15 MG tablet Take 15 mg by mouth daily. 02/05/20  Yes [provider]  memantine (NAMENDA) 10 MG tablet Take 10 mg by mouth 2 (two) times daily. 02/06/20  Yes [provider]  metFORMIN (GLUCOPHAGE-XR) 500 MG 24 hr tablet Take 1,000 mg by mouth daily with breakfast.  01/06/20  Yes [provider]  naproxen sodium (ALEVE) 220 MG tablet Take 220-440 mg by mouth 2 (two) times daily as needed (for headaches or pain).    Yes [provider]  famotidine (PEPCID) 20 MG tablet Take 1 tablet (20 mg total) by mouth 2 (two) times daily. 03/06/20   Pricilla Loveless, MD  pantoprazole (PROTONIX) 40 MG tablet Take 1 tablet (40 mg total) by mouth 2 (two) times daily before a meal. 03/06/20   Pricilla Loveless, MD    Allergies    Patient has no known allergies.  Review of Systems   Review of Systems  Constitutional: Negative for fever.  Gastrointestinal: Positive for abdominal pain and constipation. Negative for diarrhea.  Musculoskeletal: Positive for back pain.  All other systems reviewed and are negative.   Physical Exam Updated Vital Signs BP (!) 168/99 (BP Location: Left Arm)   Pulse 76   Temp 98.1 F (36.7 C) (Oral)   Resp 18   Ht 6' (1.829 m)   Wt 112  kg   SpO2 96%   BMI 33.50 kg/m   Physical Exam Vitals and nursing note reviewed.  Constitutional:      General: He is not in acute distress.    Appearance: He is well-developed. He is not ill-appearing or diaphoretic.  HENT:     Head: Normocephalic and atraumatic.     Right Ear: External ear normal.     Left Ear: External ear normal.     Nose: Nose normal.  Eyes:     General:        Right eye: No discharge.        Left eye: No discharge.  Cardiovascular:     Rate and Rhythm: Normal rate and regular rhythm.     Heart sounds: Normal heart sounds.  Pulmonary:     Effort: Pulmonary effort is normal.     Breath sounds: Normal breath sounds.  Abdominal:     Palpations: Abdomen is soft.     Tenderness: There is generalized abdominal tenderness.  Musculoskeletal:      Cervical back: Neck supple.     Thoracic back: Tenderness (diffuse) present.     Lumbar back: Tenderness (diffuse) present.  Skin:    General: Skin is warm and dry.  Neurological:     Mental Status: He is alert.  Psychiatric:        Mood and Affect: Mood is not anxious.     ED Results / Procedures / Treatments   Labs (all labs ordered are listed, but only abnormal results are displayed) Labs Reviewed  COMPREHENSIVE METABOLIC PANEL - Abnormal; Notable for the following components:      Result Value   Glucose, Bld 116 (*)    AST 13 (*)    All other components within normal limits  CBC - Abnormal; Notable for the following components:   WBC 12.7 (*)    All other components within normal limits  URINALYSIS, ROUTINE W REFLEX MICROSCOPIC - Abnormal; Notable for the following components:   Bilirubin Urine SMALL (*)    Ketones, ur 5 (*)    Protein, ur 30 (*)    Leukocytes,Ua TRACE (*)    Bacteria, UA FEW (*)    All other components within normal limits  LIPASE, BLOOD    EKG None  Radiology CT ABDOMEN PELVIS W CONTRAST  Result Date: 03/06/2020 CLINICAL DATA:  69 year old male with abdominal pain. EXAM: CT ABDOMEN AND PELVIS WITH CONTRAST TECHNIQUE: Multidetector CT imaging of the abdomen and pelvis was performed using the standard protocol following bolus administration of intravenous contrast. CONTRAST:  OMNIPAQUE IOHEXOL 300 MG/ML  SOLN COMPARISON:  None. FINDINGS: Lower chest: The visualized lung bases are clear. No intra-abdominal free air or free fluid. Hepatobiliary: Mild fatty infiltration of the liver. No intrahepatic biliary ductal dilatation. The gallbladder is unremarkable. Pancreas: Unremarkable. No pancreatic ductal dilatation or surrounding inflammatory changes. Spleen: Normal in size without focal abnormality. Adrenals/Urinary Tract: The adrenal glands are unremarkable. There is no hydronephrosis on either side. There is a 5 cm right renal upper pole cyst. There  is symmetric enhancement and excretion of contrast by both kidneys. The visualized ureters and urinary bladder appear unremarkable. Stomach/Bowel: There is sigmoid diverticulosis with muscular hypertrophy. No active inflammatory changes. There is approximately 2 cm ulcer in the distal stomach with associated inflammatory changes and thickening of the adjacent gastric wall. This likely represents a peptic ulcer, however a neoplastic process is not excluded. Further evaluation with endoscopy after resolution of acute inflammation recommended. There  is no bowel obstruction. The appendix is not visualized with certainty. No inflammatory changes identified in the right lower quadrant. Vascular/Lymphatic: There is a fusiform infrarenal abdominal aortic aneurysm with partially thrombosed lumen. The aneurysm sac measures approximately 4.2 cm in greatest AP dimension on the sagittal view and 4.7 cm in greatest axial dimension. The axial dimension however may be over measurement due to oblique orientation of the image plane to the axis of the aorta. No periaortic inflammatory changes. Evaluation of the aneurysm is limited as precontrast images are not provided. There is mild aortoiliac atherosclerotic disease. The IVC is unremarkable. No portal venous gas. There is no adenopathy. Reproductive: The prostate and seminal vesicles are grossly unremarkable. No pelvic mass. Other: None Musculoskeletal: No acute or significant osseous findings. IMPRESSION: 1. A 2 cm inflamed distal gastric ulcer. Further evaluation with endoscopy after resolution of acute inflammation recommended. 2. Sigmoid diverticulosis. No bowel obstruction. 3. Fusiform infrarenal abdominal aortic aneurysm measuring up to 4.2 cm in greatest AP dimension. Recommend followup by abdomen and pelvis CTA in 6 months, and vascular surgery referral/consultation if not already obtained. This recommendation follows ACR consensus guidelines: White Paper of the ACR  Incidental Findings Committee II on Vascular Findings. J Am Coll Radiol 2013; 10:789-794. Aortic aneurysm NOS (ICD10-I71.9) 4. Mild fatty liver. 5. Aortic Atherosclerosis (ICD10-I70.0). Electronically Signed   By: Anner Crete M.D.   On: 03/06/2020 22:31    Procedures Procedures (including critical care time)  Medications Ordered in ED Medications  sodium chloride flush (NS) 0.9 % injection 3 mL (3 mLs Intravenous Not Given 03/06/20 2319)  sodium chloride (PF) 0.9 % injection (has no administration in time range)  fentaNYL (SUBLIMAZE) injection 100 mcg (100 mcg Intravenous Given 03/06/20 2149)  sodium chloride 0.9 % bolus 1,000 mL (0 mLs Intravenous Stopped 03/06/20 2331)  iohexol (OMNIPAQUE) 300 MG/ML solution 100 mL (100 mLs Intravenous Contrast Given 03/06/20 2202)  pantoprazole (PROTONIX) EC tablet 40 mg (40 mg Oral Given 03/06/20 2326)    ED Course  I have reviewed the triage vital signs and the nursing notes.  Pertinent labs & imaging results that were available during my care of the patient were reviewed by me and considered in my medical decision making (see chart for details).    MDM Rules/Calculators/A&P                      Patient's pain is much better.  CT shows gastric ulcer.  Will treat with PPI and H2 blocker.  Hemoglobin is okay and he has no history of vomiting blood or melena/bloody stools.  There is a AAA that I discussed with Dr. Oneida Alar.  He will help arrange outpatient management.  This is unlikely to be causing his pain today.  Otherwise, we discussed return precautions and need for GI and vascular follow-up.  Counseled on stopping NSAIDs and no alcohol use. Final Clinical Impression(s) / ED Diagnoses Final diagnoses:  Acute gastric ulcer without hemorrhage or perforation  AAA (abdominal aortic aneurysm) without rupture ([place])    Rx / DC Orders ED Discharge Orders         Ordered    famotidine (PEPCID) 20 MG tablet  2 times daily     03/06/20 2315     pantoprazole (PROTONIX) 40 MG tablet  2 times daily before meals     03/06/20 2315           Sherwood Gambler, MD 03/07/20 [phone]

## 2020-03-06 NOTE — ED Triage Notes (Signed)
Patient c/o abdominal pain and the pain radiates into his lower back x 30 days. Patient denies any N/v/D

## 2020-03-07 ENCOUNTER — Telehealth: Payer: Self-pay | Admitting: Vascular Surgery

## 2020-03-07 NOTE — Telephone Encounter (Signed)
Called by [facility] ER last night.  Pt with 4.2 cm AAA no evidence of rupture also has gastric ulcer.  We will set up outpt appt in 3.4 weeks  Fabienne Bruns, MD [facility] [place] Office: [phone]

## 2020-04-03 DIAGNOSIS — K253 Acute gastric ulcer without hemorrhage or perforation: Secondary | ICD-10-CM | POA: Diagnosis not present

## 2020-04-03 DIAGNOSIS — Z1211 Encounter for screening for malignant neoplasm of colon: Secondary | ICD-10-CM | POA: Diagnosis not present

## 2020-04-06 ENCOUNTER — Ambulatory Visit: Payer: Medicare HMO | Admitting: Vascular Surgery

## 2020-04-06 ENCOUNTER — Encounter: Payer: Self-pay | Admitting: Vascular Surgery

## 2020-04-06 ENCOUNTER — Other Ambulatory Visit: Payer: Self-pay

## 2020-04-06 ENCOUNTER — Ambulatory Visit (INDEPENDENT_AMBULATORY_CARE_PROVIDER_SITE_OTHER): Payer: Medicare HMO | Admitting: Vascular Surgery

## 2020-04-06 VITALS — BP 147/85 | HR 66 | Temp 97.8°F | Resp 20 | Ht 72.0 in | Wt 240.0 lb

## 2020-04-06 DIAGNOSIS — I714 Abdominal aortic aneurysm, without rupture, unspecified: Secondary | ICD-10-CM

## 2020-04-06 DIAGNOSIS — F17210 Nicotine dependence, cigarettes, uncomplicated: Secondary | ICD-10-CM

## 2020-04-06 NOTE — Progress Notes (Signed)
Referring Physician: [place]  Patient name: Raymond Thomas MRN: [id number] DOB: 09-08-1951 Sex: male  REASON FOR CONSULT: 4.7 cm abdominal aortic aneurysm  HPI: Raymond Thomas is a 69 y.o. male, who on recent CT scan of the abdomen and pelvis for abdominal pain was noted to have a 4.7 cm abdominal aortic aneurysm.  At the time of the CT scan he was thought to have a gastric ulcer as the etiology for his abdominal pain.  He currently has no abdominal or back pain.  He does not have any family history of aneurysms.  I am not sure how reliable his history was today as he does seem to have an element of dementia and this is certainly documented in his medical record as well.  He is on 2 medications that are related to dementia.  Other medical problems include diabetes elevated cholesterol hypertension all of which are currently stable.  He currently does smoke 1/2 pack of cigarettes per day.  He was counseled against this today.  Greater than 3 minutes spent regarding smoking cessation counseling.  He seems to be overall fairly active and was going to do some yard work later today.  He does get short of breath on occasion but this is usually only with exertion.  He does not have chest pain.  Past Medical History:  Diagnosis Date  . Dementia ([place])    per gf  . Depression    according to gf  . Diabetes mellitus without complication ([place])   . Elevated cholesterol   . Hypertension    Past Surgical History:  Procedure Laterality Date  . OTHER SURGICAL HISTORY  2013   aneurysm in march 2013    Family History  Problem Relation Age of Onset  . Glaucoma Mother     SOCIAL HISTORY: Social History   Socioeconomic History  . Marital status: Single    Spouse name: Not on file  . Number of children: Not on file  . Years of education: Not on file  . Highest education level: Not on file  Occupational History  . Not on file  Tobacco Use  . Smoking status: Current Every Day Smoker    Packs/day:  0.50    Types: Cigarettes  . Smokeless tobacco: Current User  Substance and Sexual Activity  . Alcohol use: Not Currently  . Drug use: Not Currently    Comment: "used to use whatever I could"  . Sexual activity: Not on file  Other Topics Concern  . Not on file  Social History Narrative  . Not on file   Social Determinants of Health   Financial Resource Strain:   . Difficulty of Paying Living Expenses:   Food Insecurity:   . Worried About Charity fundraiser in the Last Year:   . Arboriculturist in the Last Year:   Transportation Needs:   . Film/video editor (Medical):   Marland Kitchen Lack of Transportation (Non-Medical):   Physical Activity:   . Days of Exercise per Week:   . Minutes of Exercise per Session:   Stress:   . Feeling of Stress :   Social Connections:   . Frequency of Communication with Friends and Family:   . Frequency of Social Gatherings with Friends and Family:   . Attends Religious Services:   . Active Member of Clubs or Organizations:   . Attends Archivist Meetings:   Marland Kitchen Marital Status:   Intimate Partner Violence:   .  Fear of Current or Ex-Partner:   . Emotionally Abused:   Marland Kitchen Physically Abused:   . Sexually Abused:     No Known Allergies  Current Outpatient Medications  Medication Sig Dispense Refill  . amLODipine (NORVASC) 5 MG tablet Take 5 mg by mouth daily.    . Cholecalciferol (VITAMIN D3) 50 MCG (2000 UT) TABS Take 2,000 Units by mouth daily with breakfast.    . donepezil (ARICEPT) 10 MG tablet Take 10 mg by mouth at bedtime.    . famotidine (PEPCID) 20 MG tablet Take 1 tablet (20 mg total) by mouth 2 (two) times daily. 30 tablet 0  . lovastatin (MEVACOR) 40 MG tablet Take 80 mg by mouth at bedtime.    . meloxicam (MOBIC) 15 MG tablet Take 15 mg by mouth daily.    . memantine (NAMENDA) 10 MG tablet Take 10 mg by mouth 2 (two) times daily.    . metFORMIN (GLUCOPHAGE-XR) 500 MG 24 hr tablet Take 1,000 mg by mouth daily with breakfast.      . naproxen sodium (ALEVE) 220 MG tablet Take 220-440 mg by mouth 2 (two) times daily as needed (for headaches or pain).     . pantoprazole (PROTONIX) 40 MG tablet Take 1 tablet (40 mg total) by mouth 2 (two) times daily before a meal. 60 tablet 0   No current facility-administered medications for this visit.    ROS:   General:  No weight loss, Fever, chills  HEENT: No recent headaches, no nasal bleeding, no visual changes, no sore throat  Neurologic: No dizziness, blackouts, seizures. No recent symptoms of stroke or mini- stroke. No recent episodes of slurred speech, or temporary blindness.  Cardiac: No recent episodes of chest pain/pressure, no shortness of breath at rest.  No shortness of breath with exertion.  Denies history of atrial fibrillation or irregular heartbeat  Vascular: No history of rest pain in feet.  No history of claudication.  No history of non-healing ulcer, No history of DVT   Pulmonary: No home oxygen, no productive cough, no hemoptysis,  No asthma or wheezing  Musculoskeletal:  [ ]  Arthritis, [ ]  Low back pain,  [ ]  Joint pain  Hematologic:No history of hypercoagulable state.  No history of easy bleeding.  No history of anemia  Gastrointestinal: No hematochezia or melena,  No gastroesophageal reflux, no trouble swallowing  Urinary: [ ]  chronic Kidney disease, [ ]  on HD - [ ]  MWF or [ ]  TTHS, [ ]  Burning with urination, [ ]  Frequent urination, [ ]  Difficulty urinating;   Skin: No rashes  Psychological: No history of anxiety,  No history of depression   Physical Examination  Vitals:   04/06/20 0905  BP: (!) 147/85  Pulse: 66  Resp: 20  Temp: 97.8 F (36.6 C)  Weight: 240 lb (108.9 kg)  Height: 6' (1.829 m)    Body mass index is 32.55 kg/m.  General:  Alert and oriented, no acute distress HEENT: Normal Neck: No JVD Cardiac: Regular Rate and Rhythm Abdomen: Soft, non-tender, non-distended, no mass, obese Skin: No rash Extremity Pulses:  2+  radial, brachial, femoral, dorsalis pedis, posterior tibial pulses bilaterally Musculoskeletal: No deformity or edema  Neurologic: Upper and lower extremity motor 5/5 and symmetric  DATA:  I reviewed the patient's CT scan of the abdomen which again shows a 4.7 cm infrarenal abdominal aortic aneurysm no evidence of rupture  ASSESSMENT: Asymptomatic 4.7 cm infrarenal abdominal aortic aneurysm.  Currently has no abdominal pain.  He  does have an element of dementia.   PLAN: Patient will be scheduled for follow-up ultrasound of his abdominal aorta in 6 months time.  I will try to contact his family today to make sure that everyone knows about his aneurysm and his upcoming appointments since I am not convinced that his memory registers all of this.  He did not recall what the plan was for his gastric ulcer I do not see any appointment scheduled for him so I will also discussed this with his family as well.   Fabienne Bruns, MD [facility] [place] Office: [phone] Pager: [phone]

## 2020-04-10 ENCOUNTER — Other Ambulatory Visit: Payer: Self-pay | Admitting: *Deleted

## 2020-04-10 DIAGNOSIS — I714 Abdominal aortic aneurysm, without rupture, unspecified: Secondary | ICD-10-CM

## 2020-05-11 DIAGNOSIS — Z1159 Encounter for screening for other viral diseases: Secondary | ICD-10-CM | POA: Diagnosis not present

## 2020-05-16 DIAGNOSIS — B9681 Helicobacter pylori [H. pylori] as the cause of diseases classified elsewhere: Secondary | ICD-10-CM | POA: Diagnosis not present

## 2020-05-16 DIAGNOSIS — Z1211 Encounter for screening for malignant neoplasm of colon: Secondary | ICD-10-CM | POA: Diagnosis not present

## 2020-05-16 DIAGNOSIS — K635 Polyp of colon: Secondary | ICD-10-CM | POA: Diagnosis not present

## 2020-05-16 DIAGNOSIS — K279 Peptic ulcer, site unspecified, unspecified as acute or chronic, without hemorrhage or perforation: Secondary | ICD-10-CM | POA: Diagnosis not present

## 2020-05-16 DIAGNOSIS — K219 Gastro-esophageal reflux disease without esophagitis: Secondary | ICD-10-CM | POA: Diagnosis not present

## 2020-05-16 DIAGNOSIS — R12 Heartburn: Secondary | ICD-10-CM | POA: Diagnosis not present

## 2020-05-16 DIAGNOSIS — K2100 Gastro-esophageal reflux disease with esophagitis, without bleeding: Secondary | ICD-10-CM | POA: Diagnosis not present

## 2020-05-16 DIAGNOSIS — K2900 Acute gastritis without bleeding: Secondary | ICD-10-CM | POA: Diagnosis not present

## 2020-05-16 DIAGNOSIS — K2930 Chronic superficial gastritis without bleeding: Secondary | ICD-10-CM | POA: Diagnosis not present

## 2020-05-24 DIAGNOSIS — K2930 Chronic superficial gastritis without bleeding: Secondary | ICD-10-CM | POA: Diagnosis not present

## 2020-05-24 DIAGNOSIS — B9681 Helicobacter pylori [H. pylori] as the cause of diseases classified elsewhere: Secondary | ICD-10-CM | POA: Diagnosis not present

## 2020-05-24 DIAGNOSIS — K635 Polyp of colon: Secondary | ICD-10-CM | POA: Diagnosis not present

## 2020-06-20 DIAGNOSIS — I10 Essential (primary) hypertension: Secondary | ICD-10-CM | POA: Diagnosis not present

## 2020-06-20 DIAGNOSIS — E782 Mixed hyperlipidemia: Secondary | ICD-10-CM | POA: Diagnosis not present

## 2020-06-20 DIAGNOSIS — Z125 Encounter for screening for malignant neoplasm of prostate: Secondary | ICD-10-CM | POA: Diagnosis not present

## 2020-06-20 DIAGNOSIS — R269 Unspecified abnormalities of gait and mobility: Secondary | ICD-10-CM | POA: Diagnosis not present

## 2020-06-20 DIAGNOSIS — E1165 Type 2 diabetes mellitus with hyperglycemia: Secondary | ICD-10-CM | POA: Diagnosis not present

## 2020-06-27 DIAGNOSIS — I69993 Ataxia following unspecified cerebrovascular disease: Secondary | ICD-10-CM | POA: Diagnosis not present

## 2020-06-27 DIAGNOSIS — Z0000 Encounter for general adult medical examination without abnormal findings: Secondary | ICD-10-CM | POA: Diagnosis not present

## 2020-06-27 DIAGNOSIS — I10 Essential (primary) hypertension: Secondary | ICD-10-CM | POA: Diagnosis not present

## 2020-06-27 DIAGNOSIS — I700 Atherosclerosis of aorta: Secondary | ICD-10-CM | POA: Diagnosis not present

## 2020-06-27 DIAGNOSIS — R269 Unspecified abnormalities of gait and mobility: Secondary | ICD-10-CM | POA: Diagnosis not present

## 2020-06-27 DIAGNOSIS — E782 Mixed hyperlipidemia: Secondary | ICD-10-CM | POA: Diagnosis not present

## 2020-06-27 DIAGNOSIS — E1165 Type 2 diabetes mellitus with hyperglycemia: Secondary | ICD-10-CM | POA: Diagnosis not present

## 2020-06-27 DIAGNOSIS — F0390 Unspecified dementia without behavioral disturbance: Secondary | ICD-10-CM | POA: Diagnosis not present

## 2020-09-01 DIAGNOSIS — Z23 Encounter for immunization: Secondary | ICD-10-CM | POA: Diagnosis not present

## 2020-09-01 DIAGNOSIS — E119 Type 2 diabetes mellitus without complications: Secondary | ICD-10-CM | POA: Diagnosis not present

## 2020-12-26 DIAGNOSIS — E782 Mixed hyperlipidemia: Secondary | ICD-10-CM | POA: Diagnosis not present

## 2020-12-26 DIAGNOSIS — E1165 Type 2 diabetes mellitus with hyperglycemia: Secondary | ICD-10-CM | POA: Diagnosis not present

## 2020-12-26 DIAGNOSIS — F0390 Unspecified dementia without behavioral disturbance: Secondary | ICD-10-CM | POA: Diagnosis not present

## 2020-12-26 DIAGNOSIS — I10 Essential (primary) hypertension: Secondary | ICD-10-CM | POA: Diagnosis not present

## 2020-12-26 DIAGNOSIS — Z79899 Other long term (current) drug therapy: Secondary | ICD-10-CM | POA: Diagnosis not present

## 2020-12-26 DIAGNOSIS — G500 Trigeminal neuralgia: Secondary | ICD-10-CM | POA: Diagnosis not present

## 2021-01-02 DIAGNOSIS — I10 Essential (primary) hypertension: Secondary | ICD-10-CM | POA: Diagnosis not present

## 2021-01-02 DIAGNOSIS — E782 Mixed hyperlipidemia: Secondary | ICD-10-CM | POA: Diagnosis not present

## 2021-01-02 DIAGNOSIS — I700 Atherosclerosis of aorta: Secondary | ICD-10-CM | POA: Diagnosis not present

## 2021-01-02 DIAGNOSIS — E1165 Type 2 diabetes mellitus with hyperglycemia: Secondary | ICD-10-CM | POA: Diagnosis not present

## 2021-01-02 DIAGNOSIS — N5082 Scrotal pain: Secondary | ICD-10-CM | POA: Diagnosis not present

## 2021-02-27 DIAGNOSIS — I10 Essential (primary) hypertension: Secondary | ICD-10-CM | POA: Diagnosis not present

## 2021-02-27 DIAGNOSIS — E782 Mixed hyperlipidemia: Secondary | ICD-10-CM | POA: Diagnosis not present

## 2021-02-27 DIAGNOSIS — I700 Atherosclerosis of aorta: Secondary | ICD-10-CM | POA: Diagnosis not present

## 2021-03-06 DIAGNOSIS — E782 Mixed hyperlipidemia: Secondary | ICD-10-CM | POA: Diagnosis not present

## 2021-03-06 DIAGNOSIS — I10 Essential (primary) hypertension: Secondary | ICD-10-CM | POA: Diagnosis not present

## 2021-03-06 DIAGNOSIS — E1165 Type 2 diabetes mellitus with hyperglycemia: Secondary | ICD-10-CM | POA: Diagnosis not present

## 2021-04-19 IMAGING — CT CT ABD-PELV W/ CM
2 of 5 series · 15 of 46 positions shown, 17 images · IV contrast (omnipaque)
Comparison: None.

CLINICAL DATA: 69-year-old male with abdominal pain.

EXAM:
CT ABDOMEN AND PELVIS WITH CONTRAST
TECHNIQUE: Multidetector CT imaging of the abdomen and pelvis was performed
using the standard protocol following bolus administration of
intravenous contrast.
CONTRAST:  100mL OMNIPAQUE IOHEXOL 300 MG/ML  SOLN

[Series 2: axial st · axial · 0.79mm/px · z∈[-479,-89]mm · 12 of 90 slices shown, 14 images]
[im 6/90  soft-tissue]
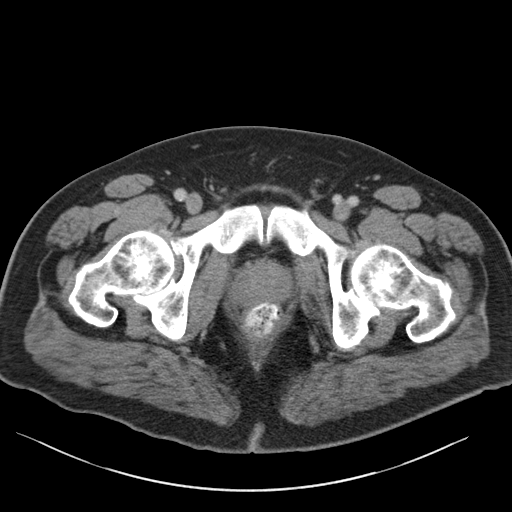
[im 6/90  bone]
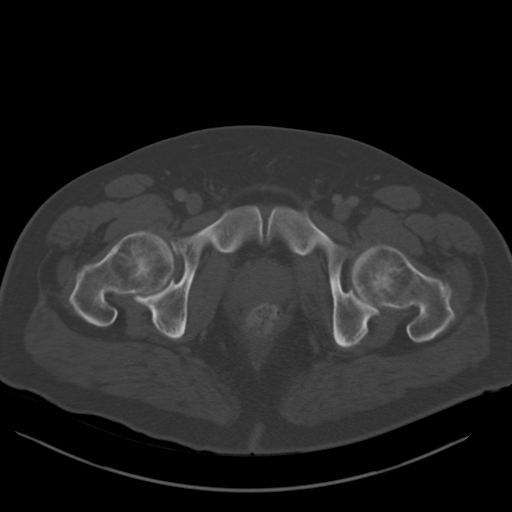
[im 12/90  soft-tissue]
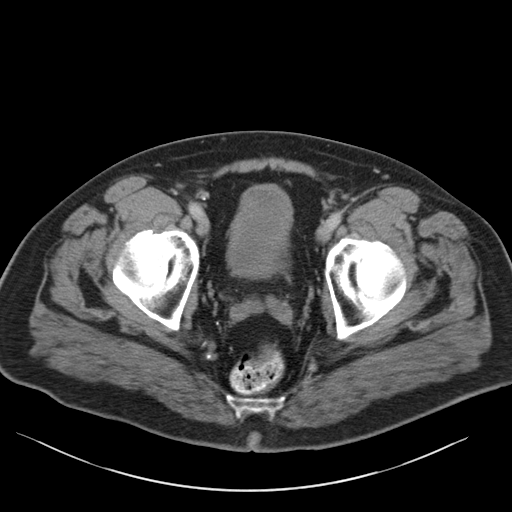
[im 18/90  soft-tissue]
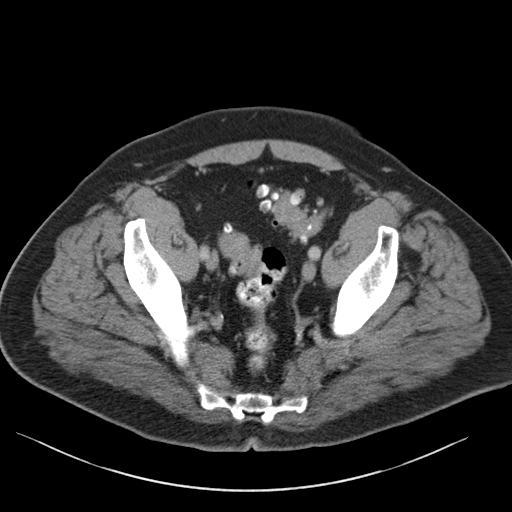
[im 30/90  soft-tissue]
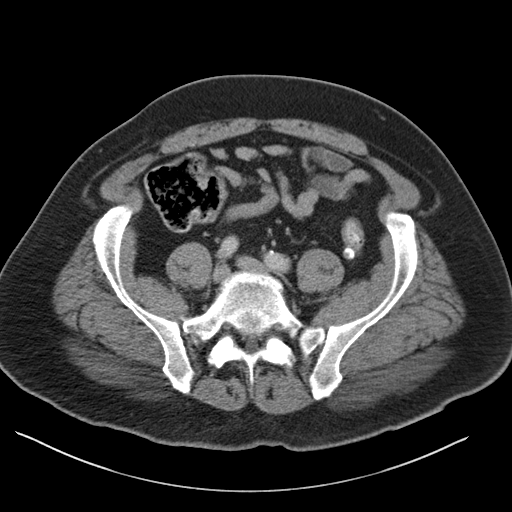
[im 36/90  soft-tissue]
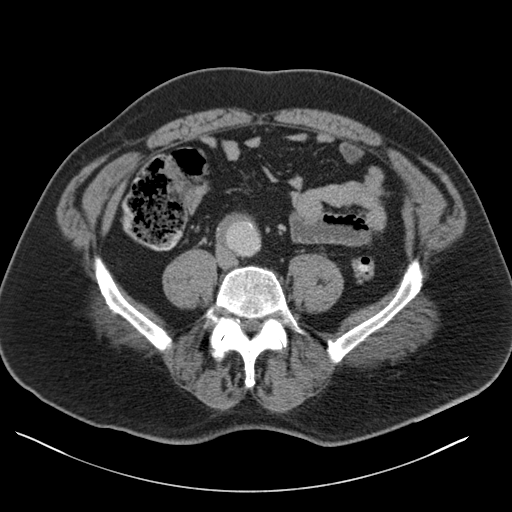
[im 42/90  soft-tissue]
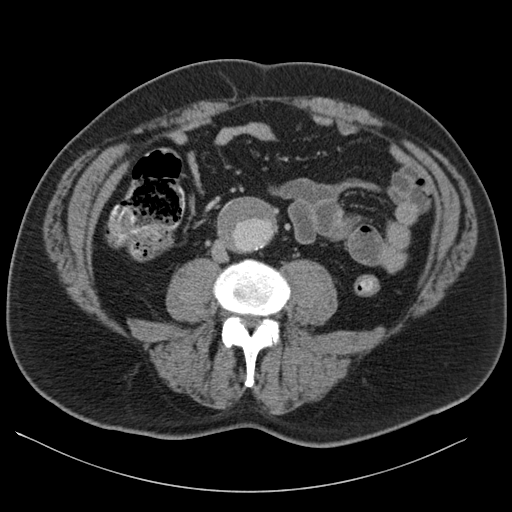
[im 48/90  soft-tissue]
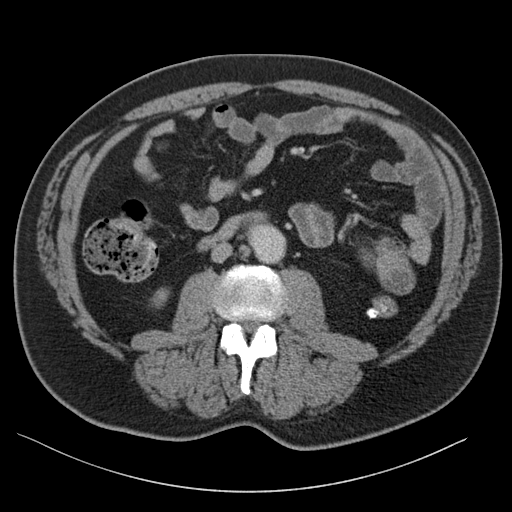
[im 54/90  soft-tissue]
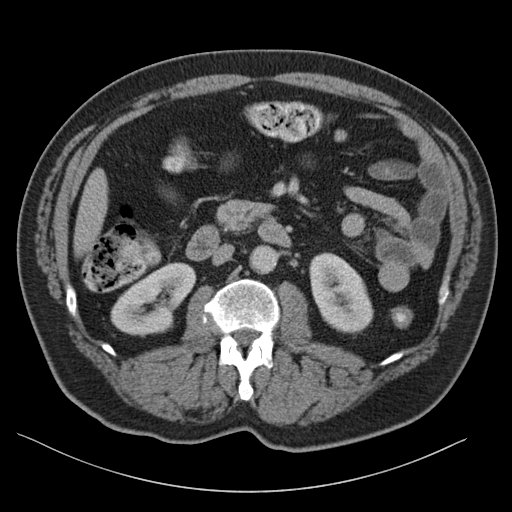
[im 60/90  soft-tissue]
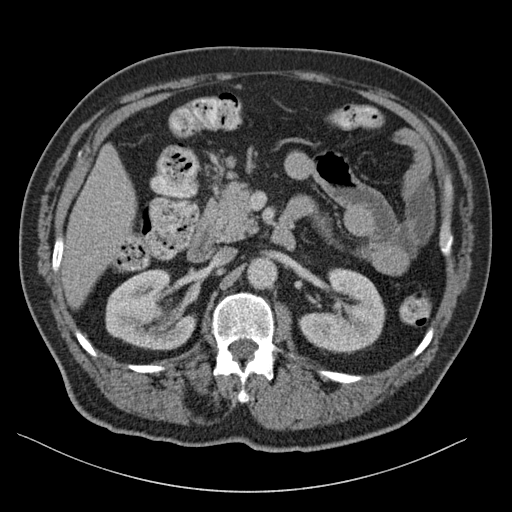
[im 60/90  bone]
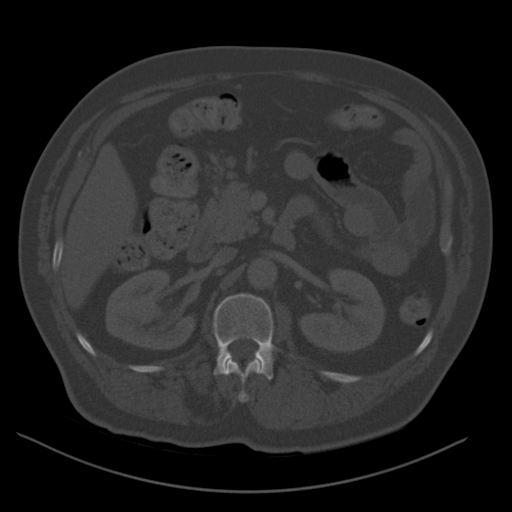
[im 72/90  soft-tissue]
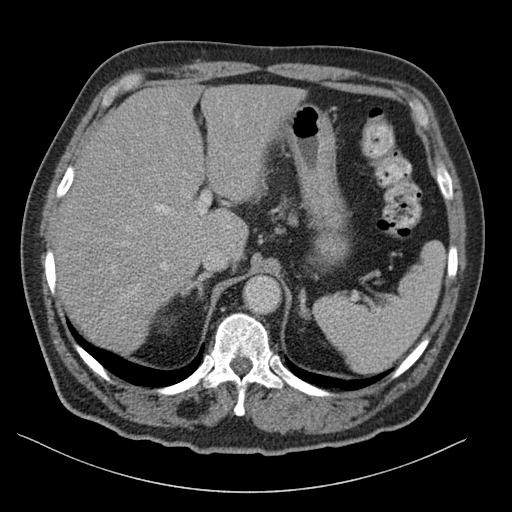
[im 78/90  soft-tissue]
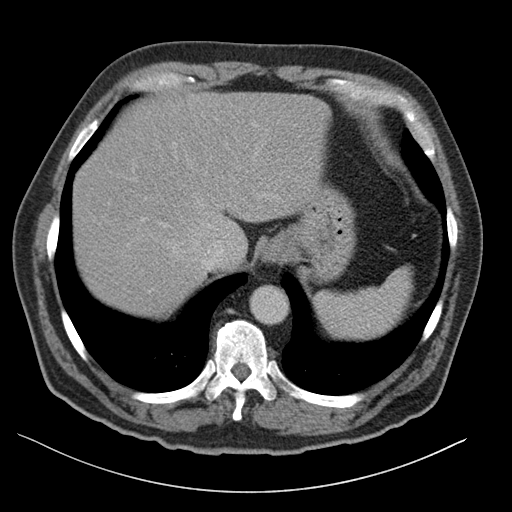
[im 84/90  soft-tissue]
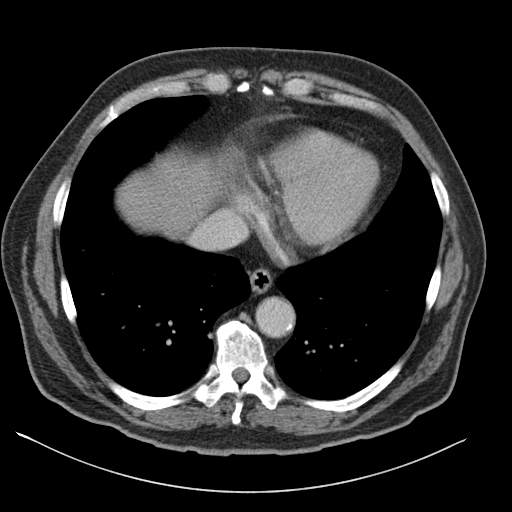

[Series 6: coronal st · coronal · 0.80mm/px · 3 of 151 slices shown]
[im 51/151  soft-tissue]
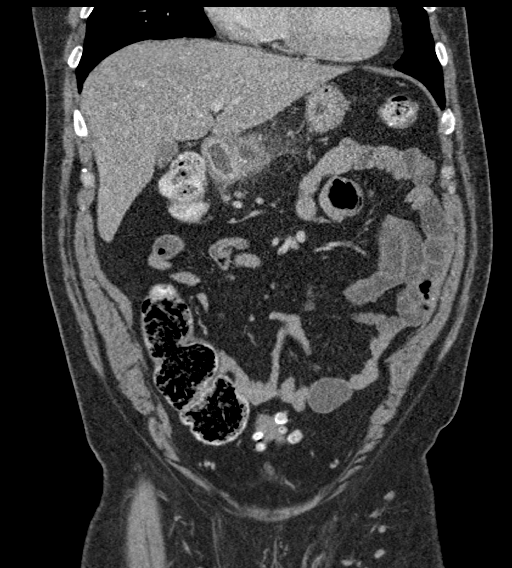
[im 67/151  soft-tissue]
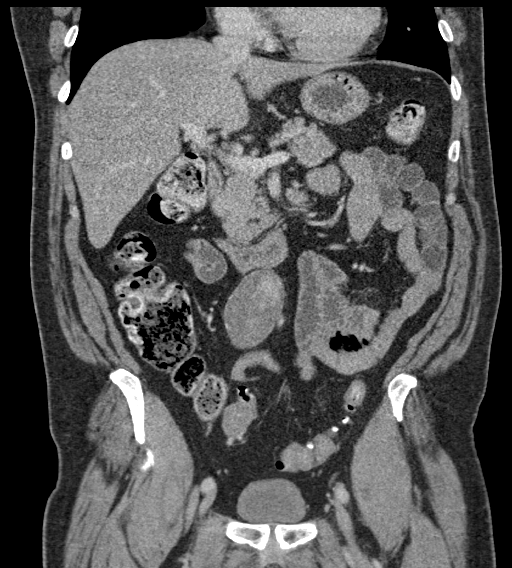
[im 84/151  soft-tissue]
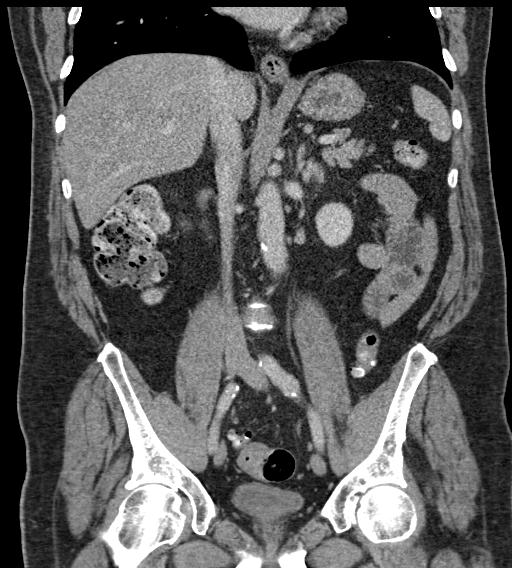

[15 of 46 positions shown; findings below may reference images not displayed]

FINDINGS: Lower chest: The visualized lung bases are clear.

No intra-abdominal free air or free fluid.

Hepatobiliary: Mild fatty infiltration of the liver. No intrahepatic
biliary ductal dilatation. The gallbladder is unremarkable.

Pancreas: Unremarkable. No pancreatic ductal dilatation or
surrounding inflammatory changes.

Spleen: Normal in size without focal abnormality.

Adrenals/Urinary Tract: The adrenal glands are unremarkable. There
is no hydronephrosis on either side. There is a 5 cm right renal
upper pole cyst. There is symmetric enhancement and excretion of
contrast by both kidneys. The visualized ureters and urinary bladder
appear unremarkable.

Stomach/Bowel: There is sigmoid diverticulosis with muscular
hypertrophy. No active inflammatory changes. There is approximately
2 cm ulcer in the distal stomach with associated inflammatory
changes and thickening of the adjacent gastric wall. This likely
represents a peptic ulcer, however a neoplastic process is not
excluded. Further evaluation with endoscopy after resolution of
acute inflammation recommended. There is no bowel obstruction. The
appendix is not visualized with certainty. No inflammatory changes
identified in the right lower quadrant.

Vascular/Lymphatic: There is a fusiform infrarenal abdominal aortic
aneurysm with partially thrombosed lumen. The aneurysm sac measures
approximately 4.2 cm in greatest AP dimension on the sagittal view
and 4.7 cm in greatest axial dimension. The axial dimension however
may be over measurement due to oblique orientation of the image
plane to the axis of the aorta. No periaortic inflammatory changes.
Evaluation of the aneurysm is limited as precontrast images are not
provided. There is mild aortoiliac atherosclerotic disease. The IVC
is unremarkable. No portal venous gas. There is no adenopathy.

Reproductive: The prostate and seminal vesicles are grossly
unremarkable. No pelvic mass.

Other: None

Musculoskeletal: No acute or significant osseous findings.
IMPRESSION: 1. A 2 cm inflamed distal gastric ulcer. Further evaluation with
endoscopy after resolution of acute inflammation recommended.
2. Sigmoid diverticulosis. No bowel obstruction.
3. Fusiform infrarenal abdominal aortic aneurysm measuring up to
cm in greatest AP dimension. Recommend followup by abdomen and
pelvis CTA in 6 months, and vascular surgery referral/consultation
if not already obtained. This recommendation follows ACR consensus
guidelines: White Paper of the ACR Incidental Findings Committee II
aneurysm NOS (R3WV5-YQO.M)
4. Mild fatty liver.
5. Aortic Atherosclerosis ([id number].R).

## 2021-05-08 DIAGNOSIS — E1165 Type 2 diabetes mellitus with hyperglycemia: Secondary | ICD-10-CM | POA: Diagnosis not present

## 2021-05-08 DIAGNOSIS — E782 Mixed hyperlipidemia: Secondary | ICD-10-CM | POA: Diagnosis not present

## 2021-05-15 DIAGNOSIS — E782 Mixed hyperlipidemia: Secondary | ICD-10-CM | POA: Diagnosis not present

## 2021-05-15 DIAGNOSIS — E1165 Type 2 diabetes mellitus with hyperglycemia: Secondary | ICD-10-CM | POA: Diagnosis not present

## 2021-05-15 DIAGNOSIS — I69993 Ataxia following unspecified cerebrovascular disease: Secondary | ICD-10-CM | POA: Diagnosis not present

## 2021-05-15 DIAGNOSIS — F0390 Unspecified dementia without behavioral disturbance: Secondary | ICD-10-CM | POA: Diagnosis not present

## 2021-08-14 DIAGNOSIS — E1165 Type 2 diabetes mellitus with hyperglycemia: Secondary | ICD-10-CM | POA: Diagnosis not present

## 2021-08-14 DIAGNOSIS — I10 Essential (primary) hypertension: Secondary | ICD-10-CM | POA: Diagnosis not present

## 2021-08-14 DIAGNOSIS — F0390 Unspecified dementia without behavioral disturbance: Secondary | ICD-10-CM | POA: Diagnosis not present

## 2021-08-14 DIAGNOSIS — I69993 Ataxia following unspecified cerebrovascular disease: Secondary | ICD-10-CM | POA: Diagnosis not present

## 2021-08-21 DIAGNOSIS — F0390 Unspecified dementia without behavioral disturbance: Secondary | ICD-10-CM | POA: Diagnosis not present

## 2021-08-21 DIAGNOSIS — I700 Atherosclerosis of aorta: Secondary | ICD-10-CM | POA: Diagnosis not present

## 2021-08-21 DIAGNOSIS — E6609 Other obesity due to excess calories: Secondary | ICD-10-CM | POA: Diagnosis not present

## 2021-08-21 DIAGNOSIS — Z0000 Encounter for general adult medical examination without abnormal findings: Secondary | ICD-10-CM | POA: Diagnosis not present

## 2021-08-21 DIAGNOSIS — Z23 Encounter for immunization: Secondary | ICD-10-CM | POA: Diagnosis not present

## 2021-08-21 DIAGNOSIS — I69993 Ataxia following unspecified cerebrovascular disease: Secondary | ICD-10-CM | POA: Diagnosis not present

## 2021-08-21 DIAGNOSIS — E1165 Type 2 diabetes mellitus with hyperglycemia: Secondary | ICD-10-CM | POA: Diagnosis not present

## 2021-08-21 DIAGNOSIS — I714 Abdominal aortic aneurysm, without rupture, unspecified: Secondary | ICD-10-CM | POA: Diagnosis not present

## 2021-08-21 DIAGNOSIS — I10 Essential (primary) hypertension: Secondary | ICD-10-CM | POA: Diagnosis not present

## 2021-09-21 ENCOUNTER — Other Ambulatory Visit: Payer: Self-pay

## 2021-09-22 ENCOUNTER — Other Ambulatory Visit: Payer: Self-pay

## 2021-09-22 DIAGNOSIS — I714 Abdominal aortic aneurysm, without rupture, unspecified: Secondary | ICD-10-CM

## 2021-10-02 NOTE — Progress Notes (Signed)
Office Note       HPI: Raymond Thomas is a 70 y.o. (1951-01-13) male presenting with known 4.7 m infrarenal abdominal aortic aneurysm.  He was last seen a year ago in the emergency department for new onset abdominal pain, with gastric ulcer appreciated.   He presents today in follow-up accompanied by his wife.  Merwin, a meat cutter by trade, and is now enjoying retirement.  Since his visit to the ED last year, MAC has had no issues with new onset abdominal, back pain, chest pain.  No family history of AAA.  My exam ambulate without symptoms of claudication, rest pain, tissue loss.    The pt is  on a statin for cholesterol management.  The pt is not on a daily aspirin.   Other AC:  - The pt is  on medication for hypertension.   The pt is not diabetic.  Tobacco hx:  daily  Past Medical History:  Diagnosis Date   Dementia ([place])    per gf   Depression    according to gf   Diabetes mellitus without complication (HCC)    Elevated cholesterol    Hypertension     Past Surgical History:  Procedure Laterality Date   OTHER SURGICAL HISTORY  2013   aneurysm in march 2013    Social History   Socioeconomic History   Marital status: Single    Spouse name: Not on file   Number of children: Not on file   Years of education: Not on file   Highest education level: Not on file  Occupational History   Not on file  Tobacco Use   Smoking status: Every Day    Packs/day: 0.50    Types: Cigarettes   Smokeless tobacco: Current  Vaping Use   Vaping Use: Never used  Substance and Sexual Activity   Alcohol use: Not Currently   Drug use: Not Currently    Comment: "used to use whatever I could"   Sexual activity: Not on file  Other Topics Concern   Not on file  Social History Narrative   Not on file   Social Determinants of Health   Financial Resource Strain: Not on file  Food Insecurity: Not on file  Transportation Needs: Not on file  Physical Activity: Not on file  Stress: Not  on file  Social Connections: Not on file  Intimate Partner Violence: Not on file    Family History  Problem Relation Age of Onset   Glaucoma Mother     Current Outpatient Medications  Medication Sig Dispense Refill   amLODipine (NORVASC) 5 MG tablet Take 5 mg by mouth daily.     Cholecalciferol (VITAMIN D3) 50 MCG (2000 UT) TABS Take 2,000 Units by mouth daily with breakfast.     donepezil (ARICEPT) 10 MG tablet Take 10 mg by mouth at bedtime.     famotidine (PEPCID) 20 MG tablet Take 1 tablet (20 mg total) by mouth 2 (two) times daily. 30 tablet 0   lovastatin (MEVACOR) 40 MG tablet Take 80 mg by mouth at bedtime.     meloxicam (MOBIC) 15 MG tablet Take 15 mg by mouth daily.     memantine (NAMENDA) 10 MG tablet Take 10 mg by mouth 2 (two) times daily.     metFORMIN (GLUCOPHAGE-XR) 500 MG 24 hr tablet Take 1,000 mg by mouth daily with breakfast.      naproxen sodium (ALEVE) 220 MG tablet Take 220-440 mg by mouth 2 (two) times daily  as needed (for headaches or pain).      pantoprazole (PROTONIX) 40 MG tablet Take 1 tablet (40 mg total) by mouth 2 (two) times daily before a meal. 60 tablet 0   No current facility-administered medications for this visit.    No Known Allergies   REVIEW OF SYSTEMS:   _0  denotes positive finding, _1  denotes negative finding Cardiac  Comments:  Chest pain or chest pressure:    Shortness of breath upon exertion:    Short of breath when lying flat:    Irregular heart rhythm:        Vascular    Pain in calf, thigh, or hip brought on by ambulation:    Pain in feet at night that wakes you up from your sleep:     Blood clot in your veins:    Leg swelling:         Pulmonary    Oxygen at home:    Productive cough:     Wheezing:         Neurologic    Sudden weakness in arms or legs:     Sudden numbness in arms or legs:     Sudden onset of difficulty speaking or slurred speech:    Temporary loss of vision in one eye:     Problems with  dizziness:         Gastrointestinal    Blood in stool:     Vomited blood:         Genitourinary    Burning when urinating:     Blood in urine:        Psychiatric    Major depression:         Hematologic    Bleeding problems:    Problems with blood clotting too easily:        Skin    Rashes or ulcers:        Constitutional    Fever or chills:      PHYSICAL EXAMINATION:  There were no vitals filed for this visit.  General:  WDWN in NAD; vital signs documented above Gait: Not observed HENT: WNL, normocephalic Pulmonary: normal non-labored breathing , without Rales, rhonchi,  wheezing Cardiac: regular HR Abdomen: soft, NT, no masses Skin: without rashes Vascular Exam/Pulses:  Right Left  Radial 2+ (normal) 2+ (normal)  Ulnar 2+ (normal) 2+ (normal)  Femoral    Popliteal No aneurysm appreciated No aneurysm appreciated  DP 2+ (normal) 2+ (normal)  PT 1+ (weak) 1+ (weak)   Extremities: without ischemic changes, without Gangrene , without cellulitis; without open wounds;  Musculoskeletal: no muscle wasting or atrophy  Neurologic: A&O X 3;  No focal weakness or paresthesias are detected Psychiatric:  The pt has Normal affect.   Non-Invasive Vascular Imaging:   Abdominal Aorta Findings:  +-----------+-------+----------+----------+--------+--------+--------+  Location   AP (cm)Trans (cm)PSV (cm/s)WaveformThrombusComments  +-----------+-------+----------+----------+--------+--------+--------+  Proximal   2.61   2.29      75                                  +-----------+-------+----------+----------+--------+--------+--------+  Mid        4.28   5.06      61                                  +-----------+-------+----------+----------+--------+--------+--------+  Distal  3.34   3.61      33                                  +-----------+-------+----------+----------+--------+--------+--------+  RT CIA Prox1.5    1.5       111                                  +-----------+-------+----------+----------+--------+--------+--------+  LT CIA Prox1.3    1.7       77                                  +-----------+-------+----------+----------+--------+--------+--------+     ASSESSMENT/PLAN: JAYLUN FLEENER is a 70 y.o. male presenting with asymptomatic infrarenal abdominal aortic aneurysm.  Aneurysm is grown from 4.7 to 5 cm over the last year and a half. I discussed the natural history of aneurysmal disease, and the literature supporting repair at 5.5 cm.   Now that the patient has reached greater than 5 cm, I will see in 64-monthincrements with aortic duplex to ensure no rapid growth.  CT abdomen pelvis from 03/16/2020 was independent reviewed demonstrating anatomy suitable for endovascular aortic repair.  I will discuss this further with MJuliusand his wife when he is closer to 5.5 cm.  Did he have interval growth at his next visit, I will order a CT angio abdomen pelvis. MGeraldinewas educated on the signs and symptoms of aortic rupture, and asked to seek immediate medical attention should any chest pain back pain, abdominal pain occur.   JBroadus John MD [facility] [phone]

## 2021-10-05 ENCOUNTER — Encounter: Payer: Self-pay | Admitting: Vascular Surgery

## 2021-10-05 ENCOUNTER — Other Ambulatory Visit: Payer: Self-pay

## 2021-10-05 ENCOUNTER — Ambulatory Visit: Payer: Medicare HMO | Admitting: Vascular Surgery

## 2021-10-05 ENCOUNTER — Ambulatory Visit (HOSPITAL_COMMUNITY)
Admission: RE | Admit: 2021-10-05 | Discharge: 2021-10-05 | Disposition: A | Discharge status: Home / Self Care | Payer: Medicare HMO | Source: Ambulatory Visit | Attending: Vascular Surgery | Admitting: Vascular Surgery

## 2021-10-05 VITALS — BP 130/70 | HR 80 | Temp 97.9°F | Resp 20 | Ht 72.0 in | Wt 248.0 lb

## 2021-10-05 DIAGNOSIS — I7143 Infrarenal abdominal aortic aneurysm, without rupture: Secondary | ICD-10-CM

## 2021-10-05 DIAGNOSIS — I714 Abdominal aortic aneurysm, without rupture, unspecified: Secondary | ICD-10-CM | POA: Diagnosis not present

## 2021-10-08 ENCOUNTER — Other Ambulatory Visit: Payer: Self-pay

## 2021-10-08 DIAGNOSIS — I7143 Infrarenal abdominal aortic aneurysm, without rupture: Secondary | ICD-10-CM

## 2022-01-08 DIAGNOSIS — R319 Hematuria, unspecified: Secondary | ICD-10-CM | POA: Diagnosis not present

## 2022-01-08 DIAGNOSIS — R1030 Lower abdominal pain, unspecified: Secondary | ICD-10-CM | POA: Diagnosis not present

## 2022-01-09 DIAGNOSIS — R319 Hematuria, unspecified: Secondary | ICD-10-CM | POA: Diagnosis not present

## 2022-02-26 DIAGNOSIS — I714 Abdominal aortic aneurysm, without rupture, unspecified: Secondary | ICD-10-CM | POA: Diagnosis not present

## 2022-02-26 DIAGNOSIS — F0390 Unspecified dementia without behavioral disturbance: Secondary | ICD-10-CM | POA: Diagnosis not present

## 2022-02-26 DIAGNOSIS — E1165 Type 2 diabetes mellitus with hyperglycemia: Secondary | ICD-10-CM | POA: Diagnosis not present

## 2022-03-05 DIAGNOSIS — I69993 Ataxia following unspecified cerebrovascular disease: Secondary | ICD-10-CM | POA: Diagnosis not present

## 2022-03-05 DIAGNOSIS — Z23 Encounter for immunization: Secondary | ICD-10-CM | POA: Diagnosis not present

## 2022-03-05 DIAGNOSIS — E782 Mixed hyperlipidemia: Secondary | ICD-10-CM | POA: Diagnosis not present

## 2022-03-05 DIAGNOSIS — F0390 Unspecified dementia without behavioral disturbance: Secondary | ICD-10-CM | POA: Diagnosis not present

## 2022-03-05 DIAGNOSIS — I700 Atherosclerosis of aorta: Secondary | ICD-10-CM | POA: Diagnosis not present

## 2022-03-05 DIAGNOSIS — I714 Abdominal aortic aneurysm, without rupture, unspecified: Secondary | ICD-10-CM | POA: Diagnosis not present

## 2022-03-05 DIAGNOSIS — E1165 Type 2 diabetes mellitus with hyperglycemia: Secondary | ICD-10-CM | POA: Diagnosis not present

## 2022-03-05 DIAGNOSIS — E6609 Other obesity due to excess calories: Secondary | ICD-10-CM | POA: Diagnosis not present

## 2022-03-05 DIAGNOSIS — I10 Essential (primary) hypertension: Secondary | ICD-10-CM | POA: Diagnosis not present

## 2022-06-25 DIAGNOSIS — E1165 Type 2 diabetes mellitus with hyperglycemia: Secondary | ICD-10-CM | POA: Diagnosis not present

## 2022-06-25 DIAGNOSIS — E782 Mixed hyperlipidemia: Secondary | ICD-10-CM | POA: Diagnosis not present

## 2022-06-25 DIAGNOSIS — F0390 Unspecified dementia without behavioral disturbance: Secondary | ICD-10-CM | POA: Diagnosis not present

## 2022-06-25 DIAGNOSIS — I10 Essential (primary) hypertension: Secondary | ICD-10-CM | POA: Diagnosis not present

## 2022-07-01 DIAGNOSIS — I10 Essential (primary) hypertension: Secondary | ICD-10-CM | POA: Diagnosis not present

## 2022-07-01 DIAGNOSIS — H5200 Hypermetropia, unspecified eye: Secondary | ICD-10-CM | POA: Diagnosis not present

## 2022-07-01 DIAGNOSIS — Z0100 Encounter for examination of eyes and vision without abnormal findings: Secondary | ICD-10-CM | POA: Diagnosis not present

## 2022-07-02 DIAGNOSIS — E1165 Type 2 diabetes mellitus with hyperglycemia: Secondary | ICD-10-CM | POA: Diagnosis not present

## 2022-07-02 DIAGNOSIS — I714 Abdominal aortic aneurysm, without rupture, unspecified: Secondary | ICD-10-CM | POA: Diagnosis not present

## 2022-07-02 DIAGNOSIS — F0390 Unspecified dementia without behavioral disturbance: Secondary | ICD-10-CM | POA: Diagnosis not present

## 2022-07-02 DIAGNOSIS — I700 Atherosclerosis of aorta: Secondary | ICD-10-CM | POA: Diagnosis not present

## 2022-07-02 DIAGNOSIS — E782 Mixed hyperlipidemia: Secondary | ICD-10-CM | POA: Diagnosis not present

## 2022-07-02 DIAGNOSIS — I10 Essential (primary) hypertension: Secondary | ICD-10-CM | POA: Diagnosis not present

## 2022-07-02 DIAGNOSIS — I69993 Ataxia following unspecified cerebrovascular disease: Secondary | ICD-10-CM | POA: Diagnosis not present

## 2022-07-02 DIAGNOSIS — E6609 Other obesity due to excess calories: Secondary | ICD-10-CM | POA: Diagnosis not present

## 2022-08-22 NOTE — Progress Notes (Signed)
Office Note       HPI: Raymond Thomas is a 71 y.o. (23-Jan-1951) male presenting for one-year follow up with known 5 cm infrarenal abdominal aortic aneurysm.    He presents today in good spirits. [name], a meat cutter by trade, and is now enjoying retirement.  Since his visit last year, MAC has had no issues - no new onset abdominal, back pain, chest pain.  No family history of AAA.  He is able to ambulate without symptoms of claudication, rest pain, tissue loss, but is fairly sedentary in retirement stating his favorite thing to do is nothing at all.    The pt is  on a statin for cholesterol management.  The pt is not on a daily aspirin.   Other AC:  - The pt is  on medication for hypertension.   The pt is not diabetic.  Tobacco hx:  daily  Past Medical History:  Diagnosis Date   AAA (abdominal aortic aneurysm)    Dementia (HCC)    per gf   Depression    according to gf   Diabetes mellitus without complication (HCC)    Elevated cholesterol    Hypertension     Past Surgical History:  Procedure Laterality Date   OTHER SURGICAL HISTORY  2013   aneurysm in march 2013    Social History   Socioeconomic History   Marital status: Single    Spouse name: Not on file   Number of children: Not on file   Years of education: Not on file   Highest education level: Not on file  Occupational History   Not on file  Tobacco Use   Smoking status: Every Day    Packs/day: 0.50    Types: Cigarettes   Smokeless tobacco: Current  Vaping Use   Vaping Use: Never used  Substance and Sexual Activity   Alcohol use: Not Currently   Drug use: Not Currently    Comment: "used to use whatever I could"   Sexual activity: Not on file  Other Topics Concern   Not on file  Social History Narrative   Not on file   Social Determinants of Health   Financial Resource Strain: Not on file  Food Insecurity: Not on file  Transportation Needs: Not on file  Physical Activity: Not on file  Stress:  Not on file  Social Connections: Not on file  Intimate Partner Violence: Not on file    Family History  Problem Relation Age of Onset   Glaucoma Mother     Current Outpatient Medications  Medication Sig Dispense Refill   amLODipine (NORVASC) 5 MG tablet Take 5 mg by mouth daily.     Cholecalciferol (VITAMIN D3) 50 MCG (2000 UT) TABS Take 2,000 Units by mouth daily with breakfast.     donepezil (ARICEPT) 10 MG tablet Take 10 mg by mouth at bedtime.     famotidine (PEPCID) 20 MG tablet Take 1 tablet (20 mg total) by mouth 2 (two) times daily. 30 tablet 0   lovastatin (MEVACOR) 40 MG tablet Take 80 mg by mouth at bedtime.     meloxicam (MOBIC) 15 MG tablet Take 15 mg by mouth daily.     memantine (NAMENDA) 10 MG tablet Take 10 mg by mouth 2 (two) times daily.     metFORMIN (GLUCOPHAGE-XR) 500 MG 24 hr tablet Take 1,000 mg by mouth daily with breakfast.      naproxen sodium (ALEVE) 220 MG tablet Take 220-440 mg by mouth 2 (  two) times daily as needed (for headaches or pain).      pantoprazole (PROTONIX) 40 MG tablet Take 1 tablet (40 mg total) by mouth 2 (two) times daily before a meal. 60 tablet 0   telmisartan (MICARDIS) 20 MG tablet      No current facility-administered medications for this visit.    No Known Allergies   REVIEW OF SYSTEMS:   _0  denotes positive finding, _1  denotes negative finding Cardiac  Comments:  Chest pain or chest pressure:    Shortness of breath upon exertion:    Short of breath when lying flat:    Irregular heart rhythm:        Vascular    Pain in calf, thigh, or hip brought on by ambulation:    Pain in feet at night that wakes you up from your sleep:     Blood clot in your veins:    Leg swelling:         Pulmonary    Oxygen at home:    Productive cough:     Wheezing:         Neurologic    Sudden weakness in arms or legs:     Sudden numbness in arms or legs:     Sudden onset of difficulty speaking or slurred speech:    Temporary loss of  vision in one eye:     Problems with dizziness:         Gastrointestinal    Blood in stool:     Vomited blood:         Genitourinary    Burning when urinating:     Blood in urine:        Psychiatric    Major depression:         Hematologic    Bleeding problems:    Problems with blood clotting too easily:        Skin    Rashes or ulcers:        Constitutional    Fever or chills:      PHYSICAL EXAMINATION:  There were no vitals filed for this visit.  General:  WDWN in NAD; vital signs documented above Gait: Not observed HENT: WNL, normocephalic Pulmonary: normal non-labored breathing , without Rales, rhonchi,  wheezing Cardiac: regular HR Abdomen: soft, NT, no masses Skin: without rashes Vascular Exam/Pulses:  Right Left  Radial 2+ (normal) 2+ (normal)  Ulnar 2+ (normal) 2+ (normal)  Femoral    Popliteal No aneurysm appreciated No aneurysm appreciated  DP 2+ (normal) 2+ (normal)  PT 1+ (weak) 1+ (weak)   Extremities: without ischemic changes, without Gangrene , without cellulitis; without open wounds;  Musculoskeletal: no muscle wasting or atrophy  Neurologic: A&O X 3;  No focal weakness or paresthesias are detected Psychiatric:  The pt has Normal affect.   Non-Invasive Vascular Imaging:    Abdominal Aorta: There is evidence of abnormal dilatation of the mid and  distal Abdominal aorta. The largest aortic measurement is 5.3 cm. The  largest aortic diameter has increased compared to prior exam. Previous  diameter measurement was 5.1 cm obtained   on 09/1821.    ASSESSMENT/PLAN: Raymond Thomas is a 71 y.o. male presenting with asymptomatic infrarenal abdominal aortic aneurysm.  Aneurysm is grown from 5 to 5.3 cm over the last year.  I discussed the natural history of aneurysmal disease, and the literature supporting repair at 5.5 cm. This point, Tadeo would benefit from CT angiogram abdomen pelvis in an  effort to further define the aneurysm. This will allow  for preoperative discussion and possible treatment should CT demonstrate aneurysm greater than 5.5 cm  Once this has been completed, I will call him with the results.  Should he need operative intervention, I will schedule an office visit to discuss surgical options as well as the risks and benefits associated.    Discussed the signs and symptoms of rupture, and I asked him to call 911 immediately should any of these occur.   Broadus John, MD [facility] [phone]   Total time of patient care including pre-visit research, consultation, and documentation greater than 20 minutes

## 2022-08-23 ENCOUNTER — Encounter: Payer: Self-pay | Admitting: Vascular Surgery

## 2022-08-23 ENCOUNTER — Ambulatory Visit (HOSPITAL_COMMUNITY)
Admission: RE | Admit: 2022-08-23 | Discharge: 2022-08-23 | Disposition: A | Discharge status: Home / Self Care | Payer: Medicare HMO | Source: Ambulatory Visit | Attending: Vascular Surgery | Admitting: Vascular Surgery

## 2022-08-23 ENCOUNTER — Ambulatory Visit: Payer: Medicare HMO | Admitting: Vascular Surgery

## 2022-08-23 VITALS — BP 153/91 | HR 73 | Temp 98.3°F | Resp 20 | Ht 72.0 in | Wt 242.0 lb

## 2022-08-23 DIAGNOSIS — I7143 Infrarenal abdominal aortic aneurysm, without rupture: Secondary | ICD-10-CM | POA: Insufficient documentation

## 2022-08-23 DIAGNOSIS — Z23 Encounter for immunization: Secondary | ICD-10-CM | POA: Diagnosis not present

## 2022-09-12 ENCOUNTER — Other Ambulatory Visit: Payer: Self-pay

## 2022-09-12 DIAGNOSIS — I7143 Infrarenal abdominal aortic aneurysm, without rupture: Secondary | ICD-10-CM

## 2022-09-25 ENCOUNTER — Ambulatory Visit
Admission: RE | Admit: 2022-09-25 | Discharge: 2022-09-25 | Disposition: A | Discharge status: Home / Self Care | Payer: Medicare HMO | Source: Ambulatory Visit | Attending: Vascular Surgery | Admitting: Vascular Surgery

## 2022-09-25 DIAGNOSIS — Q272 Other congenital malformations of renal artery: Secondary | ICD-10-CM | POA: Diagnosis not present

## 2022-09-25 DIAGNOSIS — I7143 Infrarenal abdominal aortic aneurysm, without rupture: Secondary | ICD-10-CM

## 2022-09-25 DIAGNOSIS — I513 Intracardiac thrombosis, not elsewhere classified: Secondary | ICD-10-CM | POA: Diagnosis not present

## 2022-09-25 DIAGNOSIS — K5730 Diverticulosis of large intestine without perforation or abscess without bleeding: Secondary | ICD-10-CM | POA: Diagnosis not present

## 2022-09-25 DIAGNOSIS — I714 Abdominal aortic aneurysm, without rupture, unspecified: Secondary | ICD-10-CM | POA: Diagnosis not present

## 2022-09-25 MED ORDER — IOPAMIDOL (ISOVUE-370) INJECTION 76%
75.0000 mL | Freq: Once | INTRAVENOUS | Status: AC | PRN
  Administered 2022-09-25: 75 mL via INTRAVENOUS

## 2022-10-04 ENCOUNTER — Ambulatory Visit (INDEPENDENT_AMBULATORY_CARE_PROVIDER_SITE_OTHER): Payer: Medicare HMO | Admitting: Vascular Surgery

## 2022-10-04 DIAGNOSIS — I7143 Infrarenal abdominal aortic aneurysm, without rupture: Secondary | ICD-10-CM | POA: Diagnosis not present

## 2022-10-04 NOTE — Progress Notes (Signed)
Office Note       HPI: Raymond Thomas is a 71 y.o. (12-09-1950) male with known 5 cm infrarenal abdominal aneurysm.  Called back today for a phone visit after his recent CT angio abdomen pelvis. Tres was outside working, with no complaints.  A meat cutter by trade, and is now enjoying retirement.  CT scan was ordered after his recent infrarenal abdominal aneurysm measured 5.3 cm.  No new onset abdominal, back pain, chest pain.  No family history of AAA.  He is able to ambulate without symptoms of claudication, rest pain, tissue loss, but is fairly sedentary in retirement stating his favorite thing to do is nothing at all.    The pt is  on a statin for cholesterol management.  The pt is not on a daily aspirin.   Other AC:  - The pt is  on medication for hypertension.   The pt is not diabetic.  Tobacco hx:  daily  Past Medical History:  Diagnosis Date   AAA (abdominal aortic aneurysm) ([facility])    Dementia ([facility])    per gf   Depression    according to gf   Diabetes mellitus without complication (HCC)    Elevated cholesterol    Hypertension     Past Surgical History:  Procedure Laterality Date   OTHER SURGICAL HISTORY  2013   aneurysm in march 2013    Social History   Socioeconomic History   Marital status: Single    Spouse name: Not on file   Number of children: Not on file   Years of education: Not on file   Highest education level: Not on file  Occupational History   Not on file  Tobacco Use   Smoking status: Every Day    Packs/day: 1.00    Types: Cigarettes   Smokeless tobacco: Current  Vaping Use   Vaping Use: Never used  Substance and Sexual Activity   Alcohol use: Not Currently   Drug use: Not Currently    Comment: "used to use whatever I could"   Sexual activity: Not on file  Other Topics Concern   Not on file  Social History Narrative   Not on file   Social Determinants of Health   Financial Resource Strain: Not on file  Food Insecurity: Not on  file  Transportation Needs: Not on file  Physical Activity: Not on file  Stress: Not on file  Social Connections: Not on file  Intimate Partner Violence: Not on file    Family History  Problem Relation Age of Onset   Glaucoma Mother     Current Outpatient Medications  Medication Sig Dispense Refill   amLODipine (NORVASC) 5 MG tablet Take 5 mg by mouth daily.     Cholecalciferol (VITAMIN D3) 50 MCG (2000 UT) TABS Take 2,000 Units by mouth daily with breakfast.     donepezil (ARICEPT) 10 MG tablet Take 10 mg by mouth at bedtime.     famotidine (PEPCID) 20 MG tablet Take 1 tablet (20 mg total) by mouth 2 (two) times daily. 30 tablet 0   lovastatin (MEVACOR) 40 MG tablet Take 80 mg by mouth at bedtime.     meloxicam (MOBIC) 15 MG tablet Take 15 mg by mouth daily.     memantine (NAMENDA) 10 MG tablet Take 10 mg by mouth 2 (two) times daily.     metFORMIN (GLUCOPHAGE-XR) 500 MG 24 hr tablet Take 1,000 mg by mouth daily with breakfast.      naproxen  sodium (ALEVE) 220 MG tablet Take 220-440 mg by mouth 2 (two) times daily as needed (for headaches or pain).      pantoprazole (PROTONIX) 40 MG tablet Take 1 tablet (40 mg total) by mouth 2 (two) times daily before a meal. 60 tablet 0   telmisartan (MICARDIS) 20 MG tablet      No current facility-administered medications for this visit.    No Known Allergies   REVIEW OF SYSTEMS:   _0  denotes positive finding, _1  denotes negative finding Cardiac  Comments:  Chest pain or chest pressure:    Shortness of breath upon exertion:    Short of breath when lying flat:    Irregular heart rhythm:        Vascular    Pain in calf, thigh, or hip brought on by ambulation:    Pain in feet at night that wakes you up from your sleep:     Blood clot in your veins:    Leg swelling:         Pulmonary    Oxygen at home:    Productive cough:     Wheezing:         Neurologic    Sudden weakness in arms or legs:     Sudden numbness in arms or legs:      Sudden onset of difficulty speaking or slurred speech:    Temporary loss of vision in one eye:     Problems with dizziness:         Gastrointestinal    Blood in stool:     Vomited blood:         Genitourinary    Burning when urinating:     Blood in urine:        Psychiatric    Major depression:         Hematologic    Bleeding problems:    Problems with blood clotting too easily:        Skin    Rashes or ulcers:        Constitutional    Fever or chills:      PHYSICAL EXAMINATION:  There were no vitals filed for this visit.  General:  WDWN in NAD; vital signs documented above Gait: Not observed HENT: WNL, normocephalic Pulmonary: normal non-labored breathing , without Rales, rhonchi,  wheezing Cardiac: regular HR Abdomen: soft, NT, no masses Skin: without rashes Vascular Exam/Pulses:  Right Left  Radial 2+ (normal) 2+ (normal)  Ulnar 2+ (normal) 2+ (normal)  Femoral    Popliteal No aneurysm appreciated No aneurysm appreciated  DP 2+ (normal) 2+ (normal)  PT 1+ (weak) 1+ (weak)   Extremities: without ischemic changes, without Gangrene , without cellulitis; without open wounds;  Musculoskeletal: no muscle wasting or atrophy  Neurologic: A&O X 3;  No focal weakness or paresthesias are detected Psychiatric:  The pt has Normal affect.   Non-Invasive Vascular Imaging:    CT angio demonstrates 4.9 cm infrarenal abdominal aneurysm   ASSESSMENT/PLAN: Raymond Thomas is a 71 y.o. male presenting with asymptomatic 4.9 cm infrarenal abdominal aortic aneurysm.  Smaller than what was initially seen on duplex ultrasound.  Raymond Thomas remains asymptomatic, and therefore we will continue to follow this with 79-monthduplex ultrasounds.  Discussed the signs and symptoms of rupture, and I asked him to call 911 immediately should any of these occur.   Raymond John MD [facility] [phone]  Total time of patient care including pre-visit research,  consultation, and documentation greater than 11 minutes

## 2022-10-07 ENCOUNTER — Other Ambulatory Visit: Payer: Self-pay

## 2022-10-07 DIAGNOSIS — I714 Abdominal aortic aneurysm, without rupture, unspecified: Secondary | ICD-10-CM

## 2022-10-24 DIAGNOSIS — E6609 Other obesity due to excess calories: Secondary | ICD-10-CM | POA: Diagnosis not present

## 2022-10-24 DIAGNOSIS — E782 Mixed hyperlipidemia: Secondary | ICD-10-CM | POA: Diagnosis not present

## 2022-10-24 DIAGNOSIS — E1165 Type 2 diabetes mellitus with hyperglycemia: Secondary | ICD-10-CM | POA: Diagnosis not present

## 2022-10-24 DIAGNOSIS — I69993 Ataxia following unspecified cerebrovascular disease: Secondary | ICD-10-CM | POA: Diagnosis not present

## 2022-10-24 DIAGNOSIS — F0390 Unspecified dementia without behavioral disturbance: Secondary | ICD-10-CM | POA: Diagnosis not present

## 2022-10-24 DIAGNOSIS — I714 Abdominal aortic aneurysm, without rupture, unspecified: Secondary | ICD-10-CM | POA: Diagnosis not present

## 2022-10-24 DIAGNOSIS — R5383 Other fatigue: Secondary | ICD-10-CM | POA: Diagnosis not present

## 2022-10-24 DIAGNOSIS — I10 Essential (primary) hypertension: Secondary | ICD-10-CM | POA: Diagnosis not present

## 2022-10-24 DIAGNOSIS — I700 Atherosclerosis of aorta: Secondary | ICD-10-CM | POA: Diagnosis not present

## 2022-10-31 DIAGNOSIS — F0390 Unspecified dementia without behavioral disturbance: Secondary | ICD-10-CM | POA: Diagnosis not present

## 2022-10-31 DIAGNOSIS — I700 Atherosclerosis of aorta: Secondary | ICD-10-CM | POA: Diagnosis not present

## 2022-10-31 DIAGNOSIS — I69993 Ataxia following unspecified cerebrovascular disease: Secondary | ICD-10-CM | POA: Diagnosis not present

## 2022-10-31 DIAGNOSIS — Z0000 Encounter for general adult medical examination without abnormal findings: Secondary | ICD-10-CM | POA: Diagnosis not present

## 2022-10-31 DIAGNOSIS — E1129 Type 2 diabetes mellitus with other diabetic kidney complication: Secondary | ICD-10-CM | POA: Diagnosis not present

## 2022-10-31 DIAGNOSIS — I714 Abdominal aortic aneurysm, without rupture, unspecified: Secondary | ICD-10-CM | POA: Diagnosis not present

## 2022-10-31 DIAGNOSIS — I10 Essential (primary) hypertension: Secondary | ICD-10-CM | POA: Diagnosis not present

## 2022-10-31 DIAGNOSIS — N50812 Left testicular pain: Secondary | ICD-10-CM | POA: Diagnosis not present

## 2022-10-31 DIAGNOSIS — E1165 Type 2 diabetes mellitus with hyperglycemia: Secondary | ICD-10-CM | POA: Diagnosis not present

## 2022-10-31 DIAGNOSIS — E782 Mixed hyperlipidemia: Secondary | ICD-10-CM | POA: Diagnosis not present

## 2022-10-31 DIAGNOSIS — E6609 Other obesity due to excess calories: Secondary | ICD-10-CM | POA: Diagnosis not present

## 2023-03-27 DIAGNOSIS — E6609 Other obesity due to excess calories: Secondary | ICD-10-CM | POA: Diagnosis not present

## 2023-03-27 DIAGNOSIS — I69993 Ataxia following unspecified cerebrovascular disease: Secondary | ICD-10-CM | POA: Diagnosis not present

## 2023-03-27 DIAGNOSIS — E782 Mixed hyperlipidemia: Secondary | ICD-10-CM | POA: Diagnosis not present

## 2023-03-27 DIAGNOSIS — E1129 Type 2 diabetes mellitus with other diabetic kidney complication: Secondary | ICD-10-CM | POA: Diagnosis not present

## 2023-03-27 DIAGNOSIS — I10 Essential (primary) hypertension: Secondary | ICD-10-CM | POA: Diagnosis not present

## 2023-03-27 DIAGNOSIS — F0390 Unspecified dementia without behavioral disturbance: Secondary | ICD-10-CM | POA: Diagnosis not present

## 2023-03-27 DIAGNOSIS — I714 Abdominal aortic aneurysm, without rupture, unspecified: Secondary | ICD-10-CM | POA: Diagnosis not present

## 2023-04-03 DIAGNOSIS — I10 Essential (primary) hypertension: Secondary | ICD-10-CM | POA: Diagnosis not present

## 2023-04-03 DIAGNOSIS — I700 Atherosclerosis of aorta: Secondary | ICD-10-CM | POA: Diagnosis not present

## 2023-04-03 DIAGNOSIS — F0390 Unspecified dementia without behavioral disturbance: Secondary | ICD-10-CM | POA: Diagnosis not present

## 2023-04-03 DIAGNOSIS — E782 Mixed hyperlipidemia: Secondary | ICD-10-CM | POA: Diagnosis not present

## 2023-04-03 DIAGNOSIS — I714 Abdominal aortic aneurysm, without rupture, unspecified: Secondary | ICD-10-CM | POA: Diagnosis not present

## 2023-04-03 DIAGNOSIS — I69993 Ataxia following unspecified cerebrovascular disease: Secondary | ICD-10-CM | POA: Diagnosis not present

## 2023-04-03 DIAGNOSIS — E1129 Type 2 diabetes mellitus with other diabetic kidney complication: Secondary | ICD-10-CM | POA: Diagnosis not present

## 2023-04-03 DIAGNOSIS — E6609 Other obesity due to excess calories: Secondary | ICD-10-CM | POA: Diagnosis not present

## 2023-08-08 DIAGNOSIS — I10 Essential (primary) hypertension: Secondary | ICD-10-CM | POA: Diagnosis not present

## 2023-08-08 DIAGNOSIS — I714 Abdominal aortic aneurysm, without rupture, unspecified: Secondary | ICD-10-CM | POA: Diagnosis not present

## 2023-08-08 DIAGNOSIS — E6609 Other obesity due to excess calories: Secondary | ICD-10-CM | POA: Diagnosis not present

## 2023-08-08 DIAGNOSIS — F0390 Unspecified dementia without behavioral disturbance: Secondary | ICD-10-CM | POA: Diagnosis not present

## 2023-08-08 DIAGNOSIS — I69993 Ataxia following unspecified cerebrovascular disease: Secondary | ICD-10-CM | POA: Diagnosis not present

## 2023-08-08 DIAGNOSIS — E782 Mixed hyperlipidemia: Secondary | ICD-10-CM | POA: Diagnosis not present

## 2023-08-08 DIAGNOSIS — I700 Atherosclerosis of aorta: Secondary | ICD-10-CM | POA: Diagnosis not present

## 2023-08-08 DIAGNOSIS — E1129 Type 2 diabetes mellitus with other diabetic kidney complication: Secondary | ICD-10-CM | POA: Diagnosis not present

## 2023-08-15 DIAGNOSIS — Z23 Encounter for immunization: Secondary | ICD-10-CM | POA: Diagnosis not present

## 2023-08-15 DIAGNOSIS — I10 Essential (primary) hypertension: Secondary | ICD-10-CM | POA: Diagnosis not present

## 2023-08-15 DIAGNOSIS — I714 Abdominal aortic aneurysm, without rupture, unspecified: Secondary | ICD-10-CM | POA: Diagnosis not present

## 2023-08-15 DIAGNOSIS — E6609 Other obesity due to excess calories: Secondary | ICD-10-CM | POA: Diagnosis not present

## 2023-08-15 DIAGNOSIS — I69993 Ataxia following unspecified cerebrovascular disease: Secondary | ICD-10-CM | POA: Diagnosis not present

## 2023-08-15 DIAGNOSIS — I700 Atherosclerosis of aorta: Secondary | ICD-10-CM | POA: Diagnosis not present

## 2023-08-15 DIAGNOSIS — E782 Mixed hyperlipidemia: Secondary | ICD-10-CM | POA: Diagnosis not present

## 2023-08-15 DIAGNOSIS — E1129 Type 2 diabetes mellitus with other diabetic kidney complication: Secondary | ICD-10-CM | POA: Diagnosis not present

## 2023-08-15 DIAGNOSIS — F0390 Unspecified dementia without behavioral disturbance: Secondary | ICD-10-CM | POA: Diagnosis not present

## 2023-09-03 DIAGNOSIS — M25551 Pain in right hip: Secondary | ICD-10-CM | POA: Diagnosis not present

## 2023-09-03 DIAGNOSIS — E1165 Type 2 diabetes mellitus with hyperglycemia: Secondary | ICD-10-CM | POA: Diagnosis not present

## 2023-09-03 DIAGNOSIS — F0390 Unspecified dementia without behavioral disturbance: Secondary | ICD-10-CM | POA: Diagnosis not present

## 2023-09-10 DIAGNOSIS — M25551 Pain in right hip: Secondary | ICD-10-CM | POA: Diagnosis not present

## 2023-09-10 DIAGNOSIS — F0390 Unspecified dementia without behavioral disturbance: Secondary | ICD-10-CM | POA: Diagnosis not present

## 2023-09-10 DIAGNOSIS — E1129 Type 2 diabetes mellitus with other diabetic kidney complication: Secondary | ICD-10-CM | POA: Diagnosis not present

## 2023-09-10 DIAGNOSIS — I69993 Ataxia following unspecified cerebrovascular disease: Secondary | ICD-10-CM | POA: Diagnosis not present

## 2023-09-16 DIAGNOSIS — M1990 Unspecified osteoarthritis, unspecified site: Secondary | ICD-10-CM | POA: Diagnosis not present

## 2023-09-16 DIAGNOSIS — R944 Abnormal results of kidney function studies: Secondary | ICD-10-CM | POA: Diagnosis not present

## 2023-09-16 DIAGNOSIS — M2550 Pain in unspecified joint: Secondary | ICD-10-CM | POA: Diagnosis not present

## 2023-09-16 DIAGNOSIS — M549 Dorsalgia, unspecified: Secondary | ICD-10-CM | POA: Diagnosis not present

## 2023-09-16 DIAGNOSIS — M25551 Pain in right hip: Secondary | ICD-10-CM | POA: Diagnosis not present

## 2023-12-12 DIAGNOSIS — I10 Essential (primary) hypertension: Secondary | ICD-10-CM | POA: Diagnosis not present

## 2023-12-12 DIAGNOSIS — R5383 Other fatigue: Secondary | ICD-10-CM | POA: Diagnosis not present

## 2023-12-12 DIAGNOSIS — Z125 Encounter for screening for malignant neoplasm of prostate: Secondary | ICD-10-CM | POA: Diagnosis not present

## 2023-12-12 DIAGNOSIS — E782 Mixed hyperlipidemia: Secondary | ICD-10-CM | POA: Diagnosis not present

## 2023-12-12 DIAGNOSIS — E1129 Type 2 diabetes mellitus with other diabetic kidney complication: Secondary | ICD-10-CM | POA: Diagnosis not present

## 2023-12-12 DIAGNOSIS — E6609 Other obesity due to excess calories: Secondary | ICD-10-CM | POA: Diagnosis not present

## 2023-12-19 DIAGNOSIS — F0390 Unspecified dementia without behavioral disturbance: Secondary | ICD-10-CM | POA: Diagnosis not present

## 2023-12-19 DIAGNOSIS — I69993 Ataxia following unspecified cerebrovascular disease: Secondary | ICD-10-CM | POA: Diagnosis not present

## 2023-12-19 DIAGNOSIS — I714 Abdominal aortic aneurysm, without rupture, unspecified: Secondary | ICD-10-CM | POA: Diagnosis not present

## 2023-12-19 DIAGNOSIS — Z0000 Encounter for general adult medical examination without abnormal findings: Secondary | ICD-10-CM | POA: Diagnosis not present

## 2023-12-19 DIAGNOSIS — I700 Atherosclerosis of aorta: Secondary | ICD-10-CM | POA: Diagnosis not present

## 2023-12-19 DIAGNOSIS — E6609 Other obesity due to excess calories: Secondary | ICD-10-CM | POA: Diagnosis not present

## 2023-12-19 DIAGNOSIS — I10 Essential (primary) hypertension: Secondary | ICD-10-CM | POA: Diagnosis not present

## 2023-12-19 DIAGNOSIS — E782 Mixed hyperlipidemia: Secondary | ICD-10-CM | POA: Diagnosis not present

## 2023-12-19 DIAGNOSIS — E1129 Type 2 diabetes mellitus with other diabetic kidney complication: Secondary | ICD-10-CM | POA: Diagnosis not present

## 2024-05-14 DIAGNOSIS — E119 Type 2 diabetes mellitus without complications: Secondary | ICD-10-CM | POA: Diagnosis not present

## 2024-05-20 DIAGNOSIS — E1129 Type 2 diabetes mellitus with other diabetic kidney complication: Secondary | ICD-10-CM | POA: Diagnosis not present

## 2024-05-20 DIAGNOSIS — F0390 Unspecified dementia without behavioral disturbance: Secondary | ICD-10-CM | POA: Diagnosis not present

## 2024-05-20 DIAGNOSIS — I10 Essential (primary) hypertension: Secondary | ICD-10-CM | POA: Diagnosis not present

## 2024-05-20 DIAGNOSIS — I700 Atherosclerosis of aorta: Secondary | ICD-10-CM | POA: Diagnosis not present

## 2024-05-20 DIAGNOSIS — E782 Mixed hyperlipidemia: Secondary | ICD-10-CM | POA: Diagnosis not present

## 2024-05-27 DIAGNOSIS — E6609 Other obesity due to excess calories: Secondary | ICD-10-CM | POA: Diagnosis not present

## 2024-05-27 DIAGNOSIS — F0390 Unspecified dementia without behavioral disturbance: Secondary | ICD-10-CM | POA: Diagnosis not present

## 2024-05-27 DIAGNOSIS — E782 Mixed hyperlipidemia: Secondary | ICD-10-CM | POA: Diagnosis not present

## 2024-05-27 DIAGNOSIS — N50812 Left testicular pain: Secondary | ICD-10-CM | POA: Diagnosis not present

## 2024-05-27 DIAGNOSIS — I69993 Ataxia following unspecified cerebrovascular disease: Secondary | ICD-10-CM | POA: Diagnosis not present

## 2024-05-27 DIAGNOSIS — I700 Atherosclerosis of aorta: Secondary | ICD-10-CM | POA: Diagnosis not present

## 2024-05-27 DIAGNOSIS — E1129 Type 2 diabetes mellitus with other diabetic kidney complication: Secondary | ICD-10-CM | POA: Diagnosis not present

## 2024-05-27 DIAGNOSIS — I10 Essential (primary) hypertension: Secondary | ICD-10-CM | POA: Diagnosis not present

## 2024-05-27 DIAGNOSIS — I714 Abdominal aortic aneurysm, without rupture, unspecified: Secondary | ICD-10-CM | POA: Diagnosis not present

## 2024-07-14 DIAGNOSIS — E119 Type 2 diabetes mellitus without complications: Secondary | ICD-10-CM | POA: Diagnosis not present

## 2024-09-14 DIAGNOSIS — E1129 Type 2 diabetes mellitus with other diabetic kidney complication: Secondary | ICD-10-CM | POA: Diagnosis not present

## 2024-09-30 DIAGNOSIS — I719 Aortic aneurysm of unspecified site, without rupture: Secondary | ICD-10-CM | POA: Diagnosis not present

## 2024-09-30 DIAGNOSIS — R270 Ataxia, unspecified: Secondary | ICD-10-CM | POA: Diagnosis not present

## 2024-09-30 DIAGNOSIS — R32 Unspecified urinary incontinence: Secondary | ICD-10-CM | POA: Diagnosis not present

## 2024-09-30 DIAGNOSIS — E119 Type 2 diabetes mellitus without complications: Secondary | ICD-10-CM | POA: Diagnosis not present

## 2024-09-30 DIAGNOSIS — F0390 Unspecified dementia without behavioral disturbance: Secondary | ICD-10-CM | POA: Diagnosis not present

## 2024-09-30 DIAGNOSIS — E785 Hyperlipidemia, unspecified: Secondary | ICD-10-CM | POA: Diagnosis not present

## 2024-09-30 DIAGNOSIS — I872 Venous insufficiency (chronic) (peripheral): Secondary | ICD-10-CM | POA: Diagnosis not present

## 2024-09-30 DIAGNOSIS — K219 Gastro-esophageal reflux disease without esophagitis: Secondary | ICD-10-CM | POA: Diagnosis not present

## 2024-09-30 DIAGNOSIS — E669 Obesity, unspecified: Secondary | ICD-10-CM | POA: Diagnosis not present

## 2024-09-30 DIAGNOSIS — Q272 Other congenital malformations of renal artery: Secondary | ICD-10-CM | POA: Diagnosis not present

## 2024-09-30 DIAGNOSIS — I700 Atherosclerosis of aorta: Secondary | ICD-10-CM | POA: Diagnosis not present

## 2024-09-30 DIAGNOSIS — I10 Essential (primary) hypertension: Secondary | ICD-10-CM | POA: Diagnosis not present
